# Patient Record
Sex: Female | Born: 2000 | Race: Black or African American | Hispanic: No | Marital: Single | Smoking: Never smoker
Health system: Southern US, Community
[De-identification: ages and names within clinical notes are randomized; demographics above are authoritative.]

## PROBLEM LIST (undated history)

## (undated) DIAGNOSIS — G43909 Migraine, unspecified, not intractable, without status migrainosus: Secondary | ICD-10-CM

## (undated) DIAGNOSIS — R519 Headache, unspecified: Secondary | ICD-10-CM

## (undated) DIAGNOSIS — J45909 Unspecified asthma, uncomplicated: Secondary | ICD-10-CM

## (undated) DIAGNOSIS — F32A Depression, unspecified: Secondary | ICD-10-CM

## (undated) DIAGNOSIS — F329 Major depressive disorder, single episode, unspecified: Secondary | ICD-10-CM

## (undated) DIAGNOSIS — R51 Headache: Secondary | ICD-10-CM

## (undated) HISTORY — DX: Migraine, unspecified, not intractable, without status migrainosus: G43.909

## (undated) HISTORY — DX: Unspecified asthma, uncomplicated: J45.909

## (undated) HISTORY — DX: Depression, unspecified: F32.A

## (undated) HISTORY — DX: Headache, unspecified: R51.9

## (undated) HISTORY — DX: Major depressive disorder, single episode, unspecified: F32.9

## (undated) HISTORY — DX: Headache: R51

---

## 2018-12-01 ENCOUNTER — Emergency Department (HOSPITAL_COMMUNITY): Payer: Medicaid Other

## 2018-12-01 ENCOUNTER — Emergency Department (HOSPITAL_COMMUNITY)
Admission: EM | Admit: 2018-12-01 | Discharge: 2018-12-02 | Disposition: A | Discharge status: Home / Self Care | Payer: Medicaid Other | Attending: Emergency Medicine | Admitting: Emergency Medicine

## 2018-12-01 ENCOUNTER — Other Ambulatory Visit: Payer: Self-pay

## 2018-12-01 ENCOUNTER — Encounter (HOSPITAL_COMMUNITY): Payer: Self-pay

## 2018-12-01 DIAGNOSIS — R109 Unspecified abdominal pain: Secondary | ICD-10-CM | POA: Insufficient documentation

## 2018-12-01 DIAGNOSIS — R05 Cough: Secondary | ICD-10-CM | POA: Insufficient documentation

## 2018-12-01 DIAGNOSIS — N12 Tubulo-interstitial nephritis, not specified as acute or chronic: Secondary | ICD-10-CM

## 2018-12-01 LAB — CBC WITH DIFFERENTIAL/PLATELET
ABS IMMATURE GRANULOCYTES: 0.03 10*3/uL (ref 0.00–0.07)
BASOS PCT: 0 %
Basophils Absolute: 0 10*3/uL (ref 0.0–0.1)
Eosinophils Absolute: 0 10*3/uL (ref 0.0–0.5)
Eosinophils Relative: 0 %
HCT: 42.9 % (ref 36.0–46.0)
Hemoglobin: 13.5 g/dL (ref 12.0–15.0)
Immature Granulocytes: 0 %
Lymphocytes Relative: 12 %
Lymphs Abs: 1.1 10*3/uL (ref 0.7–4.0)
MCH: 29.1 pg (ref 26.0–34.0)
MCHC: 31.5 g/dL (ref 30.0–36.0)
MCV: 92.5 fL (ref 80.0–100.0)
Monocytes Absolute: 0.9 10*3/uL (ref 0.1–1.0)
Monocytes Relative: 9 %
Neutro Abs: 7.7 10*3/uL (ref 1.7–7.7)
Neutrophils Relative %: 79 %
Platelets: 196 10*3/uL (ref 150–400)
RBC: 4.64 MIL/uL (ref 3.87–5.11)
RDW: 12.2 % (ref 11.5–15.5)
WBC: 9.7 10*3/uL (ref 4.0–10.5)
nRBC: 0 % (ref 0.0–0.2)

## 2018-12-01 LAB — COMPREHENSIVE METABOLIC PANEL
ALT: 32 U/L (ref 0–44)
AST: 28 U/L (ref 15–41)
Albumin: 4.7 g/dL (ref 3.5–5.0)
Alkaline Phosphatase: 59 U/L (ref 38–126)
Anion gap: 11 (ref 5–15)
BUN: 15 mg/dL (ref 6–20)
CO2: 21 mmol/L — AB (ref 22–32)
Calcium: 9 mg/dL (ref 8.9–10.3)
Chloride: 99 mmol/L (ref 98–111)
Creatinine, Ser: 0.96 mg/dL (ref 0.44–1.00)
GFR calc Af Amer: >60 mL/min (ref >60)
GFR calc non Af Amer: >60 mL/min (ref >60)
Glucose, Bld: 99 mg/dL (ref 70–99)
Potassium: 4 mmol/L (ref 3.5–5.1)
Sodium: 131 mmol/L — ABNORMAL LOW (ref 135–145)
Total Bilirubin: 1.9 mg/dL — ABNORMAL HIGH (ref 0.3–1.2)
Total Protein: 8.2 g/dL — ABNORMAL HIGH (ref 6.5–8.1)

## 2018-12-01 LAB — URINALYSIS, ROUTINE W REFLEX MICROSCOPIC
Bilirubin Urine: NEGATIVE
Glucose, UA: NEGATIVE mg/dL
Ketones, ur: 20 mg/dL — AB
LEUKOCYTE UA: NEGATIVE
Nitrite: POSITIVE — AB
Protein, ur: NEGATIVE mg/dL
Specific Gravity, Urine: 1.016 (ref 1.005–1.030)
pH: 6 (ref 5.0–8.0)

## 2018-12-01 LAB — POC URINE PREG, ED: Preg Test, Ur: NEGATIVE

## 2018-12-01 LAB — LACTIC ACID, PLASMA: LACTIC ACID, VENOUS: 1.6 mmol/L (ref 0.5–1.9)

## 2018-12-01 LAB — INFLUENZA PANEL BY PCR (TYPE A & B)
Influenza A By PCR: NEGATIVE
Influenza B By PCR: NEGATIVE

## 2018-12-01 MED ORDER — SODIUM CHLORIDE 0.9 % IV BOLUS
1000.0000 mL | Freq: Once | INTRAVENOUS | Status: AC
  Administered 2018-12-02: 1000 mL via INTRAVENOUS

## 2018-12-01 MED ORDER — NAPROXEN 375 MG PO TABS: 375.0000 mg | ORAL_TABLET | Freq: Two times a day (BID) | ORAL | 0 refills | Status: DC

## 2018-12-01 MED ORDER — SODIUM CHLORIDE 0.9 % IV SOLN: 2.0000 g | Freq: Once | INTRAVENOUS | Status: DC

## 2018-12-01 MED ORDER — VANCOMYCIN HCL 10 G IV SOLR
1250.0000 mg | Freq: Once | INTRAVENOUS | Status: DC
  Filled 2018-12-01: qty 1250

## 2018-12-01 MED ORDER — METRONIDAZOLE IN NACL 5-0.79 MG/ML-% IV SOLN: 500.0000 mg | Freq: Three times a day (TID) | INTRAVENOUS | Status: DC

## 2018-12-01 MED ORDER — SODIUM CHLORIDE 0.9 % IV SOLN
INTRAVENOUS | Status: DC
  Administered 2018-12-02: 02:00:00 via INTRAVENOUS

## 2018-12-01 MED ORDER — VANCOMYCIN HCL IN DEXTROSE 1-5 GM/200ML-% IV SOLN: 1000.0000 mg | Freq: Once | INTRAVENOUS | Status: DC

## 2018-12-01 MED ORDER — SODIUM CHLORIDE 0.9 % IV SOLN
2.0000 g | Freq: Once | INTRAVENOUS | Status: AC
  Administered 2018-12-02: 2 g via INTRAVENOUS
  Filled 2018-12-01: qty 20

## 2018-12-01 NOTE — ED Provider Notes (Signed)
[facility]-EMERGENCY DEPT Provider Note   CSN: [id number] Arrival date & time: 12/01/18  1624    History   Chief Complaint Chief Complaint  Patient presents with  . Rib Pain    R    HPI Rita Mcintosh is a 18 y.o. female who presents to the ED with c/o right rib pain. The pain has been off and on for months. Pain increases with deep breath and movement. Patient does cardio workout but does not remember any injury. Patient has taken nothing for pain. Patient denies chest pain, abdominal pain or shortness of breath. Patient reports having a cough and the pain increases with coughing. Today temp over 101. Patient reports that when the pain comes she develops fever and chills. Patient has been evaluated before and treated for UTI but then was called and told urine culture was negative.      HPI  History reviewed. No pertinent past medical history.  There are no active problems to display for this patient.    OB History   No obstetric history on file.      Home Medications    Prior to Admission medications   Not on File    Family History History reviewed. No pertinent family history.  Social History Social History   Tobacco Use  . Smoking status: Not on file  Substance Use Topics  . Alcohol use: Not on file  . Drug use: Not on file     Allergies   Patient has no known allergies.   Review of Systems Review of Systems  Constitutional: Positive for chills and fever.  HENT: Negative.   Eyes: Negative for visual disturbance.  Respiratory: Positive for cough. Negative for shortness of breath.   Cardiovascular: Negative for chest pain.  Gastrointestinal: Negative for abdominal pain, diarrhea, nausea and vomiting.  Genitourinary: Negative for dysuria, frequency and urgency.  Musculoskeletal: Positive for arthralgias.       Right rib pain  Skin: Negative for rash.  Neurological: Negative for syncope and headaches.    Psychiatric/Behavioral: Negative for confusion.     Physical Exam Updated Vital Signs BP 117/73 (BP Location: Right Arm)   Pulse (!) 113   Temp (!) 101.3 F (38.5 C) (Oral)   Resp 18   LMP  (LMP Unknown)   SpO2 100%   Physical Exam Vitals signs and nursing note reviewed.  Constitutional:      Appearance: She is well-developed.  HENT:     Head: Normocephalic.     Nose: Nose normal.     Mouth/Throat:     Mouth: Mucous membranes are moist.  Eyes:     Extraocular Movements: Extraocular movements intact.     Conjunctiva/sclera: Conjunctivae normal.  Neck:     Musculoskeletal: Neck supple.  Cardiovascular:     Rate and Rhythm: Regular rhythm. Tachycardia present.  Pulmonary:     Effort: Pulmonary effort is normal. No respiratory distress.     Breath sounds: No wheezing or rales.     Comments: Right flank pain Abdominal:     Palpations: Abdomen is soft.     Tenderness: There is no abdominal tenderness.  Musculoskeletal: Normal range of motion.  Skin:    General: Skin is warm and dry.  Neurological:     Mental Status: She is alert and oriented to person, place, and time.  Psychiatric:        Mood and Affect: Mood normal.      ED Treatments / Results  Labs (all  labs ordered are listed, but only abnormal results are displayed) Labs Reviewed  URINALYSIS, ROUTINE W REFLEX MICROSCOPIC - Abnormal; Notable for the following components:      Result Value   APPearance HAZY (*)    Hgb urine dipstick SMALL (*)    Ketones, ur 20 (*)    Nitrite POSITIVE (*)    Bacteria, UA RARE (*)    All other components within normal limits  CULTURE, BLOOD (ROUTINE X 2)  CULTURE, BLOOD (ROUTINE X 2)  INFLUENZA PANEL BY PCR (TYPE A & B)  CBC WITH DIFFERENTIAL/PLATELET  COMPREHENSIVE METABOLIC PANEL  LACTIC ACID, PLASMA  LACTIC ACID, PLASMA  POC URINE PREG, ED  Radiology Dg Ribs Unilateral W/chest Right  Result Date: 12/01/2018 CLINICAL DATA:  Right-sided rib pain. EXAM: RIGHT RIBS  AND CHEST - 3+ VIEW COMPARISON:  None. FINDINGS: No fracture or other bone lesions are seen involving the ribs. There is no evidence of pneumothorax or pleural effusion. Both lungs are clear. Heart size and mediastinal contours are within normal limits. IMPRESSION: Negative. Electronically Signed   By: Ted Mcalpine M.D.   On: 12/01/2018 18:31    Procedures Procedures (including critical care time)  Medications Ordered in ED Medications  0.9 %  sodium chloride infusion (has no administration in time range)     Initial Impression / Assessment and Plan / ED Course  I have reviewed the triage vital signs and the nursing notes. 18 y.o. female here with fever, chills and right flank pain but denies UTI symptoms. Urine nitrite positive. Labs and renal ultrasound ordered. Dr. Erma Heritage to assume care of the patient.   Final Clinical Impressions(s) / ED Diagnoses      Kerrie [facility], NP 12/01/18 2156    Shaune Pollack, MD 12/02/18 [phone]

## 2018-12-01 NOTE — Discharge Instructions (Addendum)
Follow up with your doctor or return here for worsening symptoms. °

## 2018-12-01 NOTE — Progress Notes (Signed)
A consult was received from an ED physician for cefepime and vancomycin per pharmacy dosing.  The patient's profile has been reviewed for ht/wt/allergies/indication/available labs.   A one time order has been placed for Cefepime 2 Gm and Vancomycin 1250 mg.  Further antibiotics/pharmacy consults should be ordered by admitting physician if indicated.                       Thank you, Lorenza Evangelist 12/01/2018  11:30 PM

## 2018-12-01 NOTE — ED Triage Notes (Addendum)
Pt reports R rib pain x "some months." No distress. No SOB. Deep breaths worsen pain. Denies injury.

## 2018-12-02 ENCOUNTER — Encounter (HOSPITAL_COMMUNITY): Payer: Self-pay

## 2018-12-02 ENCOUNTER — Emergency Department (HOSPITAL_COMMUNITY): Payer: Medicaid Other

## 2018-12-02 LAB — SEDIMENTATION RATE: Sed Rate: 11 mm/hr (ref 0–22)

## 2018-12-02 LAB — C-REACTIVE PROTEIN: CRP: 5.1 mg/dL — ABNORMAL HIGH (ref <1.0)

## 2018-12-02 MED ORDER — SODIUM CHLORIDE (PF) 0.9 % IJ SOLN
INTRAMUSCULAR | Status: AC
  Filled 2018-12-02: qty 50

## 2018-12-02 MED ORDER — CEPHALEXIN 500 MG PO CAPS: 500.0000 mg | ORAL_CAPSULE | Freq: Four times a day (QID) | ORAL | 0 refills | Status: DC

## 2018-12-02 MED ORDER — IOPAMIDOL (ISOVUE-300) INJECTION 61%
INTRAVENOUS | Status: AC
  Filled 2018-12-02: qty 100

## 2018-12-02 MED ORDER — IOPAMIDOL (ISOVUE-300) INJECTION 61%
100.0000 mL | Freq: Once | INTRAVENOUS | Status: AC | PRN
  Administered 2018-12-02: 100 mL via INTRAVENOUS

## 2018-12-04 LAB — URINE CULTURE
Culture: >=100000 — AB
Special Requests: NORMAL

## 2018-12-05 ENCOUNTER — Telehealth: Payer: Self-pay | Admitting: *Deleted

## 2018-12-05 NOTE — Telephone Encounter (Signed)
Post ED Visit - Positive Culture Follow-up  Culture report reviewed by antimicrobial stewardship pharmacist: [facility] []  Enzo Bi, Pharm.D. []  Celedonio Miyamoto, Pharm.D., BCPS AQ-ID []  Garvin Fila, Pharm.D., BCPS []  Georgina Pillion, Pharm.D., BCPS []  [place], [address].D., BCPS, AAHIVP []  Estella Husk, Pharm.D., BCPS, AAHIVP []  Lysle Pearl, PharmD, BCPS []  Phillips Climes, PharmD, BCPS []  Agapito Games, PharmD, BCPS []  Verlan Friends, PharmD []  Mervyn Gay, PharmD, BCPS []  Vinnie Level, PharmD  [facility] []  Len Childs, PharmD []  Greer Pickerel, PharmD []  Adalberto Cole, PharmD []  Perlie Gold, Rph []  Lonell Face) Jean Rosenthal, PharmD []  Earl Many, PharmD []  Junita Push, PharmD [x]  Dorna Leitz, PharmD []  Terrilee Files, PharmD []  Lynann Beaver, PharmD []  Keturah Barre, PharmD []  Loralee Pacas, PharmD []  Bernadene Person, PharmD   Positive urine culture Treated with Cephalexin, organism sensitive to the same and no further patient follow-up is required at this time.  [facility] 12/05/2018, 12:31 PM

## 2018-12-06 LAB — CULTURE, BLOOD (ROUTINE X 2)
Culture: NO GROWTH
Culture: NO GROWTH
Special Requests: ADEQUATE
Special Requests: ADEQUATE

## 2018-12-14 ENCOUNTER — Encounter: Payer: Self-pay | Admitting: Family Medicine

## 2018-12-14 ENCOUNTER — Ambulatory Visit (INDEPENDENT_AMBULATORY_CARE_PROVIDER_SITE_OTHER): Payer: 59 | Admitting: Family Medicine

## 2018-12-14 ENCOUNTER — Other Ambulatory Visit: Payer: Self-pay

## 2018-12-14 VITALS — BP 98/54 | HR 83 | Temp 98.3°F | Resp 16 | Ht 66.0 in | Wt 121.8 lb

## 2018-12-14 DIAGNOSIS — N12 Tubulo-interstitial nephritis, not specified as acute or chronic: Secondary | ICD-10-CM

## 2018-12-14 DIAGNOSIS — R300 Dysuria: Secondary | ICD-10-CM

## 2018-12-14 HISTORY — DX: Tubulo-interstitial nephritis, not specified as acute or chronic: N12

## 2018-12-14 LAB — POCT URINALYSIS DIPSTICK
Bilirubin, UA: NEGATIVE
Glucose, UA: NEGATIVE
Ketones, UA: NEGATIVE
Nitrite, UA: NEGATIVE
Protein, UA: NEGATIVE
RBC UA: NEGATIVE
Spec Grav, UA: 1.02 (ref 1.010–1.025)
Urobilinogen, UA: 0.2 E.U./dL
pH, UA: 6.5 (ref 5.0–8.0)

## 2018-12-14 NOTE — Addendum Note (Signed)
Addended by: Mammie Lorenzo on: 12/14/2018 10:46 AM   Modules accepted: Orders

## 2018-12-14 NOTE — Assessment & Plan Note (Signed)
-  Symptoms of pyelonephritis have essentially resolved at this point -Mild dysuria after intercourse, will recheck UA.  -Otherwise will plan to f/u PRN.

## 2018-12-14 NOTE — Progress Notes (Signed)
Rita Mcintosh - 18 y.o. female MRN [id number]  Date of birth: Feb 18, 2001  Subjective Chief Complaint  Patient presents with  . Follow-up    Pt states she is doing ok, she finsihed medication that she was prescribed, here today becase they suggested her f/u with a primary physican     HPI Rita Mcintosh is a 18 y.o. female here today for hospital follow up.  She was recently seen in ED rib/flank pain and diagnosed with pyelonephritis.  Given cefepime, vancomycin and flagyl in ED and discharged with cephalexin qid.  She reports that symptoms have resolved at this time and denies back/flank pain, fever, chills, nausea or vomiting.   She has had some mild dysuria after intercourse.  Denies vaginal discharge.    ROS:  A comprehensive ROS was completed and negative except as noted per HPI  No Known Allergies  History reviewed. No pertinent past medical history.  History reviewed. No pertinent surgical history.  Social History   Socioeconomic History  . Marital status: Single    Spouse name: Not on file  . Number of children: Not on file  . Years of education: Not on file  . Highest education level: Not on file  Occupational History  . Not on file  Social Needs  . Financial resource strain: Not on file  . Food insecurity:    Worry: Not on file    Inability: Not on file  . Transportation needs:    Medical: Not on file    Non-medical: Not on file  Tobacco Use  . Smoking status: Never Smoker  . Smokeless tobacco: Never Used  Substance and Sexual Activity  . Alcohol use: Never    Frequency: Never  . Drug use: Never  . Sexual activity: Not on file  Lifestyle  . Physical activity:    Days per week: Not on file    Minutes per session: Not on file  . Stress: Not on file  Relationships  . Social connections:    Talks on phone: Not on file    Gets together: Not on file    Attends religious service: Not on file    Active member of club or organization: Not on file    Attends  meetings of clubs or organizations: Not on file    Relationship status: Not on file  Other Topics Concern  . Not on file  Social History Narrative  . Not on file    No family history on file.  Health Maintenance  Topic Date Due  . HIV Screening  10/18/2013  . TETANUS/TDAP  10/18/2017  . INFLUENZA VACCINE  05/10/2018    ----------------------------------------------------------------------------------------------------------------------------------------------------------------------------------------------------------------- Physical Exam BP (!) 98/54 (BP Location: Left Arm, Patient Position: Sitting, Cuff Size: Normal)   Pulse 83   Temp 98.3 F (36.8 C) (Oral)   Resp 16   Ht 5\' 6"  (1.676 m)   Wt 121 lb 12.8 oz (55.2 kg)   LMP 12/07/2018   SpO2 95%   BMI 19.66 kg/m   Physical Exam Constitutional:      Appearance: Normal appearance.  HENT:     Head: Normocephalic and atraumatic.     Mouth/Throat:     Mouth: Mucous membranes are moist.  Eyes:     General: No scleral icterus. Neck:     Musculoskeletal: Neck supple.  Cardiovascular:     Rate and Rhythm: Normal rate and regular rhythm.  Pulmonary:     Effort: Pulmonary effort is normal.     Breath sounds:  Normal breath sounds.  Abdominal:     General: Abdomen is flat. Bowel sounds are normal. There is no distension.     Palpations: Abdomen is soft.     Tenderness: There is no abdominal tenderness. There is no right CVA tenderness or left CVA tenderness.  Skin:    General: Skin is warm and dry.  Neurological:     General: No focal deficit present.     Mental Status: She is alert.  Psychiatric:        Mood and Affect: Mood normal.        Behavior: Behavior normal.     ------------------------------------------------------------------------------------------------------------------------------------------------------------------------------------------------------------------- Assessment and  Plan  Pyelonephritis -Symptoms of pyelonephritis have essentially resolved at this point -Mild dysuria after intercourse, will recheck UA.  -Otherwise will plan to f/u PRN.

## 2018-12-14 NOTE — Patient Instructions (Signed)
-  Great to meet you! -Glad you are feeling better -We'll be in touch with urine results.

## 2018-12-15 LAB — URINE CULTURE
MICRO NUMBER:: 287119
Result:: NO GROWTH
SPECIMEN QUALITY:: ADEQUATE

## 2019-09-24 ENCOUNTER — Encounter: Payer: Self-pay | Admitting: Family Medicine

## 2019-09-24 ENCOUNTER — Telehealth (INDEPENDENT_AMBULATORY_CARE_PROVIDER_SITE_OTHER): Payer: Managed Care, Other (non HMO) | Admitting: Family Medicine

## 2019-09-24 DIAGNOSIS — S060X0A Concussion without loss of consciousness, initial encounter: Secondary | ICD-10-CM | POA: Diagnosis not present

## 2019-09-24 DIAGNOSIS — S060XAA Concussion with loss of consciousness status unknown, initial encounter: Secondary | ICD-10-CM | POA: Insufficient documentation

## 2019-09-24 DIAGNOSIS — S060X9A Concussion with loss of consciousness of unspecified duration, initial encounter: Secondary | ICD-10-CM | POA: Insufficient documentation

## 2019-09-24 MED ORDER — ONDANSETRON 4 MG PO TBDP: 4.0000 mg | ORAL_TABLET | Freq: Three times a day (TID) | ORAL | 0 refills | Status: DC | PRN

## 2019-09-24 NOTE — Assessment & Plan Note (Signed)
Her current symptoms are consistent with concussion.  Discussed avoiding any strenuous activities for now.  She should push fluids and get adequate sleep and nutrition.  She should rest as needed.  Avoid prolonged electronic screen time.  Reviewed red flags.  Rx for zofran for nausea.  Will complete paperwork for FMLA to stay out of work for the next 7-10 days as her job requires her to be in front of a computer all day.
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
## 2019-09-26 ENCOUNTER — Telehealth: Payer: Self-pay

## 2019-09-26 NOTE — Telephone Encounter (Signed)
Please allow up to 1 week from date of receipt for completion of FMLA paperwork.  It should be able to complete by tomorrow or at the latest on Monday.

## 2019-09-26 NOTE — Telephone Encounter (Unsigned)
Copied from CRM #[id number]. Topic: General - Inquiry >> Sep 26, 2019  9:33 AM Pettigrew, Teressa D wrote: Reason for CRM: pt sent FMLA papers in through My Chart in an attachment and she would like to know when they will be completed for her employer.    Pt went back to the ER yesterday and the doctor there gave her promethazine 25 mg and told her it would make for drowsy and she may not be able to work taking this medicaiton.  CB#  [phone] 

## 2019-09-26 NOTE — Telephone Encounter (Signed)
Message sent via my chart

## 2019-09-26 NOTE — Telephone Encounter (Signed)
Copied from [place] [id number]. Topic: General - Inquiry >> Sep 26, 2019  9:33 AM Greggory Keen D wrote: Reason for CRM: pt sent FMLA papers in through My Chart in an attachment and she would like to know when they will be completed for her employer.    Pt went back to the ER yesterday and the doctor there gave her promethazine 25 mg and told her it would make for drowsy and she may not be able to work taking this medicaiton.  CB#  [phone]

## 2019-10-14 ENCOUNTER — Encounter: Payer: Self-pay | Admitting: Family Medicine

## 2019-10-14 ENCOUNTER — Telehealth: Payer: Self-pay

## 2019-10-14 NOTE — Telephone Encounter (Signed)

## 2019-10-15 ENCOUNTER — Other Ambulatory Visit: Payer: Self-pay

## 2019-10-15 ENCOUNTER — Ambulatory Visit (INDEPENDENT_AMBULATORY_CARE_PROVIDER_SITE_OTHER): Payer: Self-pay | Admitting: Family Medicine

## 2019-10-15 ENCOUNTER — Encounter: Payer: Self-pay | Admitting: Family Medicine

## 2019-10-15 VITALS — BP 118/70 | HR 96 | Temp 97.9°F | Ht 66.0 in | Wt 120.0 lb

## 2019-10-15 DIAGNOSIS — R1030 Lower abdominal pain, unspecified: Secondary | ICD-10-CM

## 2019-10-15 DIAGNOSIS — S060X0A Concussion without loss of consciousness, initial encounter: Secondary | ICD-10-CM

## 2019-10-15 LAB — POCT URINALYSIS DIPSTICK
Bilirubin, UA: NEGATIVE
Blood, UA: NEGATIVE
Glucose, UA: NEGATIVE
Ketones, UA: NEGATIVE
Leukocytes, UA: NEGATIVE
Nitrite, UA: NEGATIVE
Protein, UA: NEGATIVE
Spec Grav, UA: 1.01 (ref 1.010–1.025)
Urobilinogen, UA: 1 E.U./dL
pH, UA: 8.5 — AB (ref 5.0–8.0)

## 2019-10-15 NOTE — Patient Instructions (Signed)
Try using blue light blocking glasses when using computer/phone for prolonged period of time.  Continue to rest and hydrate well.

## 2019-10-21 ENCOUNTER — Encounter: Payer: Self-pay | Admitting: Family Medicine
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
## 2019-10-21 NOTE — Telephone Encounter (Signed)
Sure, have them fax over or she can drop off form to complete.

## 2019-10-21 NOTE — Assessment & Plan Note (Signed)
Symptoms slowly improving still with headache exacerbated by electronic screen.   Recommend trial of blue light blocking glasses Limit screen time, she should take at least a 30 minute break for every 2 hours of screen time, letter provided.  She may need additional FMLA paperwork completed which I agreed to complete as needed.

## 2019-10-25 NOTE — Telephone Encounter (Signed)
Completed.

## 2019-10-31 ENCOUNTER — Encounter: Payer: Self-pay | Admitting: Family Medicine

## 2020-03-06 ENCOUNTER — Telehealth (INDEPENDENT_AMBULATORY_CARE_PROVIDER_SITE_OTHER): Payer: BC Managed Care – PPO | Admitting: Nurse Practitioner

## 2020-03-06 ENCOUNTER — Encounter: Payer: Self-pay | Admitting: Nurse Practitioner

## 2020-03-06 DIAGNOSIS — J0140 Acute pansinusitis, unspecified: Secondary | ICD-10-CM | POA: Diagnosis not present

## 2020-03-06 MED ORDER — PREDNISONE 20 MG PO TABS: 30.0000 mg | ORAL_TABLET | Freq: Every day | ORAL | 0 refills | Status: DC

## 2020-03-06 MED ORDER — SALINE SPRAY 0.65 % NA SOLN: 1.0000 | NASAL | 0 refills | Status: DC | PRN

## 2020-03-06 MED ORDER — MUCINEX 600 MG PO TB12: 600.0000 mg | ORAL_TABLET | Freq: Two times a day (BID) | ORAL | 0 refills | Status: DC | PRN

## 2020-03-06 MED ORDER — AZITHROMYCIN 250 MG PO TABS: 250.0000 mg | ORAL_TABLET | Freq: Every day | ORAL | 0 refills | Status: DC

## 2020-03-06 NOTE — Patient Instructions (Signed)
Stop oral decongestant Maintain adequate oral hydration Take above medications with food. Call office if no improvement in 2weeks.

## 2020-03-06 NOTE — Progress Notes (Signed)
Virtual Visit via Video Note  I connected with@ on 03/06/20 at 11:00 AM EDT by a video enabled telemedicine application and verified that I am speaking with the correct person using two identifiers.  Location: Patient:Home Provider: Office Participants: patient and provider  I discussed the limitations of evaluation and management by telemedicine and the availability of in person appointments. I also discussed with the patient that there may be a patient responsible charge related to this service. The patient expressed understanding and agreed to proceed.  CC:She is C/O congestion that started 1-week-ago. There is pressure that is periorbital, frontal, and temporal. She says that she feels like she has a constant severe headache that is behind her right eye. Denies any ear issues. Nasal mucous has gone from yellow to green. Denies any cough. Had negative Covid test. Tx with Robitussin and DayQuil but Sx persist.  History of Present Illness: Sinusitis This is a new problem. The current episode started 1 to 4 weeks ago. The problem has been gradually worsening since onset. There has been no fever. Associated symptoms include congestion, ear pain and headaches. Pertinent negatives include no chills, coughing, diaphoresis, hoarse voice, neck pain, shortness of breath, sinus pressure, sneezing, sore throat or swollen glands. Past treatments include acetaminophen and oral decongestants. The treatment provided mild relief.  amoxicillin 500mg  used tooth infection 1week ago.  Observations/Objective: Physical Exam  Constitutional: She is oriented to person, place, and time. No distress.  Eyes: Conjunctivae and EOM are normal.  Pulmonary/Chest: Effort normal.  Musculoskeletal:     Cervical back: Normal range of motion and neck supple.  Neurological: She is alert and oriented to person, place, and time.   Assessment and Plan: Laylah was seen today for transitions of care and  sinusitis.  Diagnoses and all orders for this visit:  Acute non-recurrent pansinusitis -     azithromycin (ZITHROMAX Z-PAK) 250 MG tablet; Take 1 tablet (250 mg total) by mouth daily. Take 2tabs on first day, then 1tab once a day till complete -     predniSONE (DELTASONE) 20 MG tablet; Take 1.5 tablets (30 mg total) by mouth daily with breakfast. -     sodium chloride (OCEAN) 0.65 % SOLN nasal spray; Place 1 spray into both nostrils as needed for congestion. -     guaiFENesin (MUCINEX) 600 MG 12 hr tablet; Take 1 tablet (600 mg total) by mouth 2 (two) times daily as needed for cough or to loosen phlegm.   Follow Up Instructions: Stop oral decongestant Maintain adequate oral hydration Take above medications with food. Call office if no improvement in 2weeks.  I discussed the assessment and treatment plan with the patient. The patient was provided an opportunity to ask questions and all were answered. The patient agreed with the plan and demonstrated an understanding of the instructions.   The patient was advised to call back or seek an in-person evaluation if the symptoms worsen or if the condition fails to improve as anticipated.   , NP

## 2020-06-26 ENCOUNTER — Telehealth (INDEPENDENT_AMBULATORY_CARE_PROVIDER_SITE_OTHER): Payer: BC Managed Care – PPO | Admitting: Nurse Practitioner

## 2020-06-26 ENCOUNTER — Telehealth: Payer: BC Managed Care – PPO | Admitting: Emergency Medicine

## 2020-06-26 ENCOUNTER — Encounter: Payer: Self-pay | Admitting: Nurse Practitioner

## 2020-06-26 VITALS — Ht 66.0 in

## 2020-06-26 DIAGNOSIS — J069 Acute upper respiratory infection, unspecified: Secondary | ICD-10-CM | POA: Diagnosis not present

## 2020-06-26 DIAGNOSIS — Z20822 Contact with and (suspected) exposure to covid-19: Secondary | ICD-10-CM | POA: Diagnosis not present

## 2020-06-26 MED ORDER — BENZONATATE 100 MG PO CAPS: 100.0000 mg | ORAL_CAPSULE | Freq: Three times a day (TID) | ORAL | 0 refills | Status: DC | PRN

## 2020-06-26 MED ORDER — ALBUTEROL SULFATE HFA 108 (90 BASE) MCG/ACT IN AERS: 1.0000 | INHALATION_SPRAY | Freq: Four times a day (QID) | RESPIRATORY_TRACT | 0 refills | Status: DC | PRN

## 2020-06-26 NOTE — Progress Notes (Signed)
Virtual Visit via Video Note  I connected with@ on 06/26/20 at 10:45 AM EDT by a video enabled telemedicine application and verified that I am speaking with the correct person using two identifiers.  Location: Patient:Home Provider: Office Participants: patient and provider  I discussed the limitations of evaluation and management by telemedicine and the availability of in person appointments. I also discussed with the patient that there may be a patient responsible charge related to this service. The patient expressed understanding and agreed to proceed.  CC: cough and fever, no COVID vaccine  History of Present Illness: Cough This is a new problem. The current episode started in the past 7 days. The problem has been unchanged. The cough is non-productive. Associated symptoms include chills, a fever and headaches. Pertinent negatives include no heartburn, hemoptysis, myalgias, nasal congestion, postnasal drip, rash, rhinorrhea, sore throat, shortness of breath, sweats or wheezing. Associated symptoms comments: Chest tightness with deep breathing. She has tried OTC cough suppressant for the symptoms. The treatment provided mild relief. There is no history of asthma or environmental allergies.   Observations/Objective: Physical Exam Vitals reviewed.  Constitutional:      General: She is not in acute distress.    Appearance: She is not ill-appearing.  Cardiovascular:     Pulses: Normal pulses.  Neurological:     Mental Status: She is alert and oriented to person, place, and time.    Assessment and Plan: Rita Mcintosh was seen today for acute visit.  Diagnoses and all orders for this visit:  Viral upper respiratory tract infection -     albuterol (VENTOLIN HFA) 108 (90 Base) MCG/ACT inhaler; Inhale 1-2 puffs into the lungs every 6 (six) hours as needed for wheezing or shortness of breath.   Follow Up Instructions: Encourage adequate oral hydration. Take benzonatate and albuterol as  prescribed. Use over-the-counter  "cold" medicines  such as "Tylenol cold" , "Advil cold",  "Mucinex" or" Mucinex D"  for cough and congestion.  Avoid decongestants if you have high blood pressure. Use" Delsym" or" Robitussin" cough syrup varietis for cough.  You can use plain "Tylenol" or "Advi"l for fever, chills and achyness.  "Common cold" symptoms are usually triggered by a virus.  The antibiotics are usually not necessary. On average, a" viral cold" illness would take 4-7 days to resolve. Please, make an appointment if you are not better or if you're worse.  I discussed the assessment and treatment plan with the patient. The patient was provided an opportunity to ask questions and all were answered. The patient agreed with the plan and demonstrated an understanding of the instructions.   The patient was advised to call back or seek an in-person evaluation if the symptoms worsen or if the condition fails to improve as anticipated. Alysia Penna, NP

## 2020-06-26 NOTE — Patient Instructions (Signed)
Encourage adequate oral hydration. Use over-the-counter  "cold" medicines  such as "Tylenol cold" , "Advil cold",  "Mucinex" or" Mucinex D"  for cough and congestion.  Avoid decongestants if you have high blood pressure. Use" Delsym" or" Robitussin" cough syrup varietis for cough.  You can use plain "Tylenol" or "Advi"l for fever, chills and achyness.  "Common cold" symptoms are usually triggered by a virus.  The antibiotics are usually not necessary. On average, a" viral cold" illness would take 4-7 days to resolve. Please, make an appointment if you are not better or if you're worse.     Person Under Monitoring Name: Rita Mcintosh  Location: [address] [place] [address]   Infection Prevention Recommendations for Individuals Confirmed to have, or Being Evaluated for, 2019 Novel Coronavirus (COVID-19) Infection Who Receive Care at Home  Individuals who are confirmed to have, or are being evaluated for, COVID-19 should follow the prevention steps below until a healthcare provider or local or state health department says they can return to normal activities.  Stay home except to get medical care You should restrict activities outside your home, except for getting medical care. Do not go to work, school, or public areas, and do not use public transportation or taxis.  Call ahead before visiting your doctor Before your medical appointment, call the healthcare provider and tell them that you have, or are being evaluated for, COVID-19 infection. This will help the healthcare provider's office take steps to keep other people from getting infected. Ask your healthcare provider to call the local or state health department.  Monitor your symptoms Seek prompt medical attention if your illness is worsening (e.g., difficulty breathing). Before going to your medical appointment, call the healthcare provider and tell them that you have, or are being evaluated for, COVID-19 infection.  Ask your healthcare provider to call the local or state health department.  Wear a facemask You should wear a facemask that covers your nose and mouth when you are in the same room with other people and when you visit a healthcare provider. People who live with or visit you should also wear a facemask while they are in the same room with you.  Separate yourself from other people in your home As much as possible, you should stay in a different room from other people in your home. Also, you should use a separate bathroom, if available.  Avoid sharing household items You should not share dishes, drinking glasses, cups, eating utensils, towels, bedding, or other items with other people in your home. After using these items, you should wash them thoroughly with soap and water.  Cover your coughs and sneezes Cover your mouth and nose with a tissue when you cough or sneeze, or you can cough or sneeze into your sleeve. Throw used tissues in a lined trash can, and immediately wash your hands with soap and water for at least 20 seconds or use an alcohol-based hand rub.  Wash your [employer] your hands often and thoroughly with soap and water for at least 20 seconds. You can use an alcohol-based hand sanitizer if soap and water are not available and if your hands are not visibly dirty. Avoid touching your eyes, nose, and mouth with unwashed hands.   Prevention Steps for Caregivers and Household Members of Individuals Confirmed to have, or Being Evaluated for, COVID-19 Infection Being Cared for in the Home  If you live with, or provide care at home for, a person confirmed to have,  or being evaluated for, COVID-19 infection please follow these guidelines to prevent infection:  Follow healthcare provider's instructions Make sure that you understand and can help the patient follow any healthcare provider instructions for all care.  Provide for the patient's basic needs You should help the  patient with basic needs in the home and provide support for getting groceries, prescriptions, and other personal needs.  Monitor the patient's symptoms If they are getting sicker, call his or her medical provider and tell them that the patient has, or is being evaluated for, COVID-19 infection. This will help the healthcare provider's office take steps to keep other people from getting infected. Ask the healthcare provider to call the local or state health department.  Limit the number of people who have contact with the patient  If possible, have only one caregiver for the patient.  Other household members should stay in another home or place of residence. If this is not possible, they should stay  in another room, or be separated from the patient as much as possible. Use a separate bathroom, if available.  Restrict visitors who do not have an essential need to be in the home.  Keep older adults, very young children, and other sick people away from the patient Keep older adults, very young children, and those who have compromised immune systems or chronic health conditions away from the patient. This includes people with chronic heart, lung, or kidney conditions, diabetes, and cancer.  Ensure good ventilation Make sure that shared spaces in the home have good air flow, such as from an air conditioner or an opened window, weather permitting.  Wash your hands often  Wash your hands often and thoroughly with soap and water for at least 20 seconds. You can use an alcohol based hand sanitizer if soap and water are not available and if your hands are not visibly dirty.  Avoid touching your eyes, nose, and mouth with unwashed hands.  Use disposable paper towels to dry your hands. If not available, use dedicated cloth towels and replace them when they become wet.  Wear a facemask and gloves  Wear a disposable facemask at all times in the room and gloves when you touch or have contact  with the patient's blood, body fluids, and/or secretions or excretions, such as sweat, saliva, sputum, nasal mucus, vomit, urine, or feces.  Ensure the mask fits over your nose and mouth tightly, and do not touch it during use.  Throw out disposable facemasks and gloves after using them. Do not reuse.  Wash your hands immediately after removing your facemask and gloves.  If your personal clothing becomes contaminated, carefully remove clothing and launder. Wash your hands after handling contaminated clothing.  Place all used disposable facemasks, gloves, and other waste in a lined container before disposing them with other household waste.  Remove gloves and wash your hands immediately after handling these items.  Do not share dishes, glasses, or other household items with the patient  Avoid sharing household items. You should not share dishes, drinking glasses, cups, eating utensils, towels, bedding, or other items with a patient who is confirmed to have, or being evaluated for, COVID-19 infection.  After the person uses these items, you should wash them thoroughly with soap and water.  Wash laundry thoroughly  Immediately remove and wash clothes or bedding that have blood, body fluids, and/or secretions or excretions, such as sweat, saliva, sputum, nasal mucus, vomit, urine, or feces, on them.  Wear gloves when handling  laundry from the patient.  Read and follow directions on labels of laundry or clothing items and detergent. In general, wash and dry with the warmest temperatures recommended on the label.  Clean all areas the individual has used often  Clean all touchable surfaces, such as counters, tabletops, doorknobs, bathroom fixtures, toilets, phones, keyboards, tablets, and bedside tables, every day. Also, clean any surfaces that may have blood, body fluids, and/or secretions or excretions on them.  Wear gloves when cleaning surfaces the patient has come in contact with.  Use  a diluted bleach solution (e.g., dilute bleach with 1 part bleach and 10 parts water) or a household disinfectant with a label that says EPA-registered for coronaviruses. To make a bleach solution at home, add 1 tablespoon of bleach to 1 quart (4 cups) of water. For a larger supply, add  cup of bleach to 1 gallon (16 cups) of water.  Read labels of cleaning products and follow recommendations provided on product labels. Labels contain instructions for safe and effective use of the cleaning product including precautions you should take when applying the product, such as wearing gloves or eye protection and making sure you have good ventilation during use of the product.  Remove gloves and wash hands immediately after cleaning.  Monitor yourself for signs and symptoms of illness Caregivers and household members are considered close contacts, should monitor their health, and will be asked to limit movement outside of the home to the extent possible. Follow the monitoring steps for close contacts listed on the symptom monitoring form.   ? If you have additional questions, contact your local health department or call the epidemiologist on call at [phone] (available 24/7). ? This guidance is subject to change. For the most up-to-date guidance from [employer], please refer to their website: [email or web address]hu

## 2020-06-26 NOTE — Progress Notes (Signed)
E-Visit for Corona Virus Screening  Your current symptoms could be consistent with the coronavirus.  Many health care providers can now test patients at their office but not all are.  [facility] has multiple testing sites. For information on our COVID testing locations and hours go to [email or web address]  We are enrolling you in our MyChart Home Monitoring for COVID19 . Daily you will receive a questionnaire within the MyChart website. Our COVID 19 response team will be monitoring your responses daily.  Testing Information: The COVID-19 Community Testing sites are testing BY APPOINTMENT ONLY.  You can schedule online at [email or web address]  If you do not have access to a smart phone or computer you may call [phone] for an appointment.   Additional testing sites in the Community:  . For [facility] Testing sites in [place]  https://www.cvs.com/minuteclinic/covid-19-testing  . For Pop-up testing sites in [place]  https://covid19.ncdhhs.gov/about-covid-19/testing/find-my-testing-place/pop-testing-sites  . For [facility] https://[place].gov[place]/human-services/health-department/coronavirus-covid-19-info/covid-19-testing  . For [place] testing in [place] and [place] https://www.[place].gov/[place]/human-services/health-department/coronavirus-covid-19-info/covid-19-testing  . For [facility] testing in [place]   https://[facility].care/covidtesting  For  more information about community testing call [phone]   Please quarantine yourself while awaiting your test results. Please stay home for a minimum of 10 days from the first day of illness with improving symptoms and you have had 24 hours of no fever (without the use of Tylenol (Acetaminophen) Motrin (Ibuprofen) or any fever reducing medication).  Also - Do not get tested prior to returning to work because once you have had a positive test the test can stay  positive for more than a month in some cases.   You should wear a mask or cloth face covering over your nose and mouth if you must be around other people or animals, including pets (even at home). Try to stay at least 6 feet away from other people. This will protect the people around you.  Please continue good preventive care measures, including:  frequent hand-washing, avoid touching your face, cover coughs/sneezes, stay out of crowds and keep a 6 foot distance from others.  COVID-19 is a respiratory illness with symptoms that are similar to the flu. Symptoms are typically mild to moderate, but there have been cases of severe illness and death due to the virus.   The following symptoms may appear 2-14 days after exposure: . Fever . Cough . Shortness of breath or difficulty breathing . Chills . Repeated shaking with chills . Muscle pain . Headache . Sore throat . New loss of taste or smell . Fatigue . Congestion or runny nose . Nausea or vomiting . Diarrhea  Go to the nearest hospital ED for assessment if fever/cough/breathlessness are severe or illness seems like a threat to life.  It is vitally important that if you feel that you have an infection such as this virus or any other virus that you stay home and away from places where you may spread it to others.  You should avoid contact with people age 65 and older.   You can use medication such as A prescription cough medication called Tessalon Perles 100 mg. You may take 1-2 capsules every 8 hours as needed for cough  You may also take acetaminophen (Tylenol) as needed for fever.  Reduce your risk of any infection by using the same precautions used for avoiding the common cold or flu:  . Wash your hands often with soap and warm water for at least 20 seconds.  If soap and water are not   readily available, use an alcohol-based hand sanitizer with at least 60% alcohol.  . If coughing or sneezing, cover your mouth and nose by coughing or  sneezing into the elbow areas of your shirt or coat, into a tissue or into your sleeve (not your hands). . Avoid shaking hands with others and consider head nods or verbal greetings only. . Avoid touching your eyes, nose, or mouth with unwashed hands.  . Avoid close contact with people who are sick. . Avoid places or events with large numbers of people in one location, like concerts or sporting events. . Carefully consider travel plans you have or are making. . If you are planning any travel outside or inside the [place], visit the CDC's Travelers' Health webpage for the latest health notices. . If you have some symptoms but not all symptoms, continue to monitor at home and seek medical attention if your symptoms worsen. . If you are having a medical emergency, call 911.  HOME CARE . Only take medications as instructed by your medical team. . Drink plenty of fluids and get plenty of rest. . A steam or ultrasonic humidifier can help if you have congestion.   GET HELP RIGHT AWAY IF YOU HAVE EMERGENCY WARNING SIGNS** FOR COVID-19. If you or someone is showing any of these signs seek emergency medical care immediately. Call 911 or proceed to your closest emergency facility if: . You develop worsening high fever. . Trouble breathing . Bluish lips or face . Persistent pain or pressure in the chest . New confusion . Inability to wake or stay awake . You cough up blood. . Your symptoms become more severe  **This list is not all possible symptoms. Contact your medical provider for any symptoms that are sever or concerning to you.  MAKE SURE YOU   Understand these instructions.  Will watch your condition.  Will get help right away if you are not doing well or get worse.  Your e-visit answers were reviewed by a board certified advanced clinical practitioner to complete your personal care plan.  Depending on the condition, your plan could have included both over the counter or prescription  medications.  If there is a problem please reply once you have received a response from your provider.  Your safety is important to [place].  If you have drug allergies check your prescription carefully.    You can use MyChart to ask questions about today's visit, request a non-urgent call back, or ask for a work or school excuse for 24 hours related to this e-Visit. If it has been greater than 24 hours you will need to follow up with your provider, or enter a new e-Visit to address those concerns. You will get an e-mail in the next two days asking about your experience.  I hope that your e-visit has been valuable and will speed your recovery. Thank you for using e-visits.   **Please do not respond to this message unless you have follow up questions.** Greater than 5 but less than 10 minutes spent researching, coordinating, and implementing care for this patient today

## 2020-07-01 ENCOUNTER — Telehealth: Payer: Self-pay | Admitting: Nurse Practitioner

## 2020-07-01 DIAGNOSIS — J209 Acute bronchitis, unspecified: Secondary | ICD-10-CM

## 2020-07-01 NOTE — Telephone Encounter (Signed)
Patient is calling to see if she can get a school note. She was told to quaratine for 10 days and missed school. If approved, please send through MyChart and call her at [phone] to let her know that it has been sent to her MyChart.

## 2020-07-01 NOTE — Telephone Encounter (Signed)
LVM for patient to return call to confirm she is symptom free before clearing her to return to school.

## 2020-07-03 ENCOUNTER — Encounter: Payer: Self-pay | Admitting: Nurse Practitioner

## 2020-07-03 MED ORDER — METHYLPREDNISOLONE 4 MG PO TBPK: ORAL_TABLET | ORAL | 0 refills | Status: DC

## 2020-07-03 NOTE — Telephone Encounter (Signed)
Letter written Sent oral prednisone to treat acute bronchitis. Ask if she went for COVID test?

## 2020-07-03 NOTE — Telephone Encounter (Signed)
Pt states she was tested for COVID today and was informed results would be in 2 days.

## 2020-07-03 NOTE — Telephone Encounter (Signed)
Spoke with patient and she states she still has a productive cough and chest discomfort due to the cough. 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

## 2020-08-06 ENCOUNTER — Telehealth: Payer: BC Managed Care – PPO | Admitting: Physician Assistant

## 2020-08-06 DIAGNOSIS — J069 Acute upper respiratory infection, unspecified: Secondary | ICD-10-CM | POA: Diagnosis not present

## 2020-08-06 MED ORDER — BENZONATATE 100 MG PO CAPS: 100.0000 mg | ORAL_CAPSULE | Freq: Three times a day (TID) | ORAL | 0 refills | Status: DC | PRN

## 2020-08-06 MED ORDER — ALBUTEROL SULFATE HFA 108 (90 BASE) MCG/ACT IN AERS: 1.0000 | INHALATION_SPRAY | Freq: Four times a day (QID) | RESPIRATORY_TRACT | 0 refills | Status: AC | PRN

## 2020-08-06 NOTE — Progress Notes (Signed)
We are sorry that you are not feeling well.  Here is how we plan to help!  Based on your presentation I believe you most likely have A cough due to a virus.  This is called viral bronchitis and is best treated by rest, plenty of fluids and control of the cough.  You may use Ibuprofen or Tylenol as directed to help your symptoms.     In addition you may use A prescription cough medication called Tessalon Perles 100mg . You may take 1-2 capsules every 8 hours as needed for your cough.  An albuterol inhaler every 4-6 hours as needed   From your responses in the eVisit questionnaire you describe inflammation in the upper respiratory tract which is causing a significant cough.  This is commonly called Bronchitis and has four common causes:    Allergies  Viral Infections  Acid Reflux  Bacterial Infection Allergies, viruses and acid reflux are treated by controlling symptoms or eliminating the cause. An example might be a cough caused by taking certain blood pressure medications. You stop the cough by changing the medication. Another example might be a cough caused by acid reflux. Controlling the reflux helps control the cough.  USE OF BRONCHODILATOR ("RESCUE") INHALERS: There is a risk from using your bronchodilator too frequently.  The risk is that over-reliance on a medication which only relaxes the muscles surrounding the breathing tubes can reduce the effectiveness of medications prescribed to reduce swelling and congestion of the tubes themselves.  Although you feel brief relief from the bronchodilator inhaler, your asthma may actually be worsening with the tubes becoming more swollen and filled with mucus.  This can delay other crucial treatments, such as oral steroid medications. If you need to use a bronchodilator inhaler daily, several times per day, you should discuss this with your provider.  There are probably better treatments that could be used to keep your asthma under control.     HOME  CARE . Only take medications as instructed by your medical team. . Complete the entire course of an antibiotic. . Drink plenty of fluids and get plenty of rest. . Avoid close contacts especially the very young and the elderly . Cover your mouth if you cough or cough into your sleeve. . Always remember to wash your hands . A steam or ultrasonic humidifier can help congestion.   GET HELP RIGHT AWAY IF: . You develop worsening fever. . You become short of breath . You cough up blood. . Your symptoms persist after you have completed your treatment plan MAKE SURE YOU   Understand these instructions.  Will watch your condition.  Will get help right away if you are not doing well or get worse.  Your e-visit answers were reviewed by a board certified advanced clinical practitioner to complete your personal care plan.  Depending on the condition, your plan could have included both over the counter or prescription medications. If there is a problem please reply  once you have received a response from your provider. Your safety is important to .  If you have drug allergies check your prescription carefully.    You can use MyChart to ask questions about today's visit, request a non-urgent call back, or ask for a work or school excuse for 24 hours related to this e-Visit. If it has been greater than 24 hours you will need to follow up with your provider, or enter a new e-Visit to address those concerns. You will get an e-mail in the  next two days asking about your experience.  I hope that your e-visit has been valuable and will speed your recovery. Thank you for using e-visits.  Greater than 5 minutes, yet less than 10 minutes of time have been spent researching, coordinating, and implementing care for this patient today.  Jarold Motto PA-C

## 2020-11-23 ENCOUNTER — Other Ambulatory Visit: Payer: Self-pay

## 2020-11-24 ENCOUNTER — Ambulatory Visit (INDEPENDENT_AMBULATORY_CARE_PROVIDER_SITE_OTHER): Payer: BC Managed Care – PPO | Admitting: Nurse Practitioner

## 2020-11-24 ENCOUNTER — Encounter: Payer: Self-pay | Admitting: Nurse Practitioner

## 2020-11-24 VITALS — BP 100/70 | HR 76 | Temp 97.8°F | Ht 65.75 in | Wt 120.6 lb

## 2020-11-24 DIAGNOSIS — G43709 Chronic migraine without aura, not intractable, without status migrainosus: Secondary | ICD-10-CM | POA: Diagnosis not present

## 2020-11-24 MED ORDER — RIZATRIPTAN BENZOATE 5 MG PO TABS: 5.0000 mg | ORAL_TABLET | ORAL | 0 refills | Status: DC | PRN

## 2020-11-24 MED ORDER — PROMETHAZINE HCL 12.5 MG PO TABS: 12.5000 mg | ORAL_TABLET | Freq: Three times a day (TID) | ORAL | 0 refills | Status: DC | PRN

## 2020-11-24 NOTE — Patient Instructions (Signed)

## 2020-11-24 NOTE — Progress Notes (Signed)
Subjective:  Patient ID: Rita Mcintosh, female    DOB: 10-Oct-2001  Age: 20 y.o. MRN: [id number]  CC: Establish Care (New patient/Pt c/o migraines since childhood (age 71), worse in the past month. Pt has tried otc ibuprofen, alieve, and tylenol with no relief. Declines flu vaccine. )  Migraine  This is a chronic problem. The current episode started more than 1 year ago (onset at age 3). The problem occurs constantly. The problem has been waxing and waning. The pain is located in the occipital and frontal region. The pain does not radiate. The pain quality is similar to prior headaches. The quality of the pain is described as aching and dull. The pain is severe. Associated symptoms include eye pain, nausea and photophobia. Pertinent negatives include no abdominal pain, abnormal behavior, anorexia, back pain, blurred vision, coughing, dizziness, drainage, ear pain, eye redness, eye watering, facial sweating, fever, hearing loss, insomnia, loss of balance, muscle aches, neck pain, numbness, phonophobia, rhinorrhea, scalp tenderness, seizures, sinus pressure, sore throat, swollen glands, tingling, tinnitus, visual change, vomiting, weakness or weight loss. The symptoms are aggravated by bright light and emotional stress. She has tried Excedrin, acetaminophen, NSAIDs and darkened room for the symptoms. The treatment provided mild relief. Her past medical history is significant for migraine headaches. There is no history of cancer, cluster headaches, hypertension, immunosuppression, migraines in the family, obesity, pseudotumor cerebri, recent head traumas, sinus disease or TMJ.   Reviewed past Medical, Social and Family history today.  Outpatient Medications Prior to Visit  Medication Sig Dispense Refill  . albuterol (VENTOLIN HFA) 108 (90 Base) MCG/ACT inhaler Inhale 1-2 puffs into the lungs every 6 (six) hours as needed for wheezing or shortness of breath. 6.7 g 0  . benzonatate (TESSALON PERLES) 100 MG  capsule Take 1 capsule (100 mg total) by mouth 3 (three) times daily as needed for cough (cough). (Patient not taking: Reported on 11/24/2020) 20 capsule 0  . methylPREDNISolone (MEDROL DOSEPAK) 4 MG TBPK tablet Take as directed on package (Patient not taking: Reported on 11/24/2020) 21 tablet 0  . MICROGESTIN 1-20 MG-MCG tablet Take 1 tablet by mouth daily. (Patient not taking: No sig reported)     No facility-administered medications prior to visit.    ROS See HPI  Objective:  BP 100/70 (BP Location: Left Arm, Patient Position: Sitting, Cuff Size: Normal)   Pulse 76   Temp 97.8 F (36.6 C) (Temporal)   Ht 5' 5.75" (1.67 m)   Wt 120 lb 9.6 oz (54.7 kg)   SpO2 98%   BMI 19.61 kg/m   Physical Exam Vitals reviewed.  HENT:     Right Ear: Tympanic membrane, ear canal and external ear normal.     Left Ear: Tympanic membrane, ear canal and external ear normal.  Eyes:     Extraocular Movements: Extraocular movements intact.     Conjunctiva/sclera: Conjunctivae normal.  Cardiovascular:     Rate and Rhythm: Normal rate and regular rhythm.     Pulses: Normal pulses.     Heart sounds: Normal heart sounds.  Musculoskeletal:     Cervical back: Normal range of motion and neck supple.  Lymphadenopathy:     Cervical: No cervical adenopathy.  Neurological:     Mental Status: She is alert and oriented to person, place, and time.     Cranial Nerves: No cranial nerve deficit.    Assessment & Plan:  This visit occurred during the SARS-CoV-2 public health emergency.  Safety protocols were in place,  including screening questions prior to the visit, additional usage of staff PPE, and extensive cleaning of exam room while observing appropriate contact time as indicated for disinfecting solutions.   Teresia was seen today for establish care.  Diagnoses and all orders for this visit:  Chronic migraine without aura without status migrainosus, not intractable -     rizatriptan (MAXALT) 5 MG  tablet; Take 1 tablet (5 mg total) by mouth as needed for migraine. May repeat in 2 hours if needed. No more than 30mg  in 24hrs    Problem List Items Addressed This Visit   None   Visit Diagnoses    Chronic migraine without aura without status migrainosus, not intractable    -  Primary   Relevant Medications   rizatriptan (MAXALT) 5 MG tablet      Follow-up: Return in about 4 weeks (around 12/22/2020) for CPE and migraine f/up (fasting).  12/24/2020, NP

## 2020-11-24 NOTE — Assessment & Plan Note (Addendum)
Onset at age 20 per patient, daily, trigger: bright light and stress. Associated with photophobia and nausea. No improvement with use of bluelight screen, NSAIDs, tylenol and excedrin. No change in characteristics (aching, dull, frontal and behind eyes)  maxalt and promethazine rx sent F/up in 89month

## 2020-12-08 NOTE — Telephone Encounter (Signed)
Pt called about this and asked if she can have an appt sooner just to discuss because she needs it turned in sooner

## 2020-12-09 NOTE — Telephone Encounter (Signed)
yes

## 2020-12-11 ENCOUNTER — Telehealth: Payer: Self-pay | Admitting: Nurse Practitioner

## 2020-12-11 DIAGNOSIS — G43719 Chronic migraine without aura, intractable, without status migrainosus: Secondary | ICD-10-CM

## 2020-12-11 MED ORDER — AMITRIPTYLINE HCL 10 MG PO TABS: 10.0000 mg | ORAL_TABLET | Freq: Every day | ORAL | 5 refills | Status: DC

## 2020-12-11 NOTE — Assessment & Plan Note (Signed)
Chronic but worse in last 2-3yrs. persistent migraine episodes every other day, varies from mild to severe, use of OTC medications for mild episodes and maxalt 1-2tabs for severe episodes. maxalt provides significant relief but she has to sleep for a few hours. Current use of COC x 13yrs, prescribed by GYN She agreed to start elavil 10mg  at [facility] and go for head CT. Maintain appt on 12/30/20 for CPE and migraine re eval. Consider changing COC to progesterone only if no improvement

## 2020-12-11 NOTE — Telephone Encounter (Signed)
Rita Mcintosh reports persistent migraine episodes every other day, varies from mild to severe, use of OTC medications for mild episodes and maxalt 1-2tabs for severe episodes. maxalt provides significant relief but she has to sleep for a few hours. Current use of COC x 89yrs, prescribed by GYN She agreed to start elavil 10mg  at [facility] and go for head CT. Maintain appt on 12/30/20 for CPE and migraine re eval.

## 2020-12-11 NOTE — Telephone Encounter (Signed)
Pt called back and Claris Gower was on the phone so I sent secure chat and let pt know she would receive a call back at [phone]

## 2020-12-11 NOTE — Addendum Note (Signed)
Addended by: Alysia Penna L on: 12/11/2020 12:58 PM   Modules accepted: Orders

## 2020-12-11 NOTE — Telephone Encounter (Signed)
Called to discuss FMLA form. She stated she will have to call back because she was at work. I provided the number [phone] to call back.

## 2020-12-16 ENCOUNTER — Ambulatory Visit: Payer: BC Managed Care – PPO | Admitting: Nurse Practitioner

## 2020-12-30 ENCOUNTER — Encounter: Payer: BC Managed Care – PPO | Admitting: Nurse Practitioner

## 2020-12-30 ENCOUNTER — Encounter: Payer: Self-pay | Admitting: Nurse Practitioner

## 2021-01-08 ENCOUNTER — Other Ambulatory Visit (HOSPITAL_BASED_OUTPATIENT_CLINIC_OR_DEPARTMENT_OTHER): Payer: Self-pay | Admitting: Emergency Medicine

## 2021-01-08 ENCOUNTER — Emergency Department (HOSPITAL_BASED_OUTPATIENT_CLINIC_OR_DEPARTMENT_OTHER)
Admission: EM | Admit: 2021-01-08 | Discharge: 2021-01-08 | Disposition: A | Discharge status: Home / Self Care | Payer: BC Managed Care – PPO | Attending: Emergency Medicine | Admitting: Emergency Medicine

## 2021-01-08 ENCOUNTER — Ambulatory Visit (HOSPITAL_BASED_OUTPATIENT_CLINIC_OR_DEPARTMENT_OTHER): Admit: 2021-01-08 | Payer: BC Managed Care – PPO

## 2021-01-08 ENCOUNTER — Encounter (HOSPITAL_BASED_OUTPATIENT_CLINIC_OR_DEPARTMENT_OTHER): Payer: Self-pay | Admitting: Emergency Medicine

## 2021-01-08 ENCOUNTER — Ambulatory Visit (HOSPITAL_BASED_OUTPATIENT_CLINIC_OR_DEPARTMENT_OTHER)
Admission: RE | Admit: 2021-01-08 | Discharge: 2021-01-08 | Disposition: A | Discharge status: Home / Self Care | Payer: BC Managed Care – PPO | Source: Ambulatory Visit | Attending: Emergency Medicine | Admitting: Emergency Medicine

## 2021-01-08 ENCOUNTER — Other Ambulatory Visit: Payer: Self-pay

## 2021-01-08 DIAGNOSIS — R102 Pelvic and perineal pain: Secondary | ICD-10-CM | POA: Insufficient documentation

## 2021-01-08 DIAGNOSIS — O26891 Other specified pregnancy related conditions, first trimester: Secondary | ICD-10-CM | POA: Insufficient documentation

## 2021-01-08 DIAGNOSIS — Z3A08 8 weeks gestation of pregnancy: Secondary | ICD-10-CM | POA: Diagnosis not present

## 2021-01-08 DIAGNOSIS — O26899 Other specified pregnancy related conditions, unspecified trimester: Secondary | ICD-10-CM

## 2021-01-08 DIAGNOSIS — J45909 Unspecified asthma, uncomplicated: Secondary | ICD-10-CM | POA: Insufficient documentation

## 2021-01-08 LAB — URINALYSIS, ROUTINE W REFLEX MICROSCOPIC
Bilirubin Urine: NEGATIVE
Glucose, UA: NEGATIVE mg/dL
Hgb urine dipstick: NEGATIVE
Ketones, ur: NEGATIVE mg/dL
Leukocytes,Ua: NEGATIVE
Nitrite: NEGATIVE
Protein, ur: NEGATIVE mg/dL
Specific Gravity, Urine: 1.02 (ref 1.005–1.030)
pH: 6.5 (ref 5.0–8.0)

## 2021-01-08 LAB — HCG, QUANTITATIVE, PREGNANCY: hCG, Beta Chain, Quant, S: 2944 m[IU]/mL — ABNORMAL HIGH (ref <5)

## 2021-01-08 LAB — ABO/RH: ABO/RH(D): B POS

## 2021-01-08 LAB — PREGNANCY, URINE: Preg Test, Ur: NEGATIVE

## 2021-01-08 MED ORDER — ACETAMINOPHEN 325 MG PO TABS
650.0000 mg | ORAL_TABLET | Freq: Once | ORAL | Status: AC
  Administered 2021-01-08: 650 mg via ORAL
  Filled 2021-01-08: qty 2

## 2021-01-08 NOTE — ED Triage Notes (Signed)
Pt c/o lower abd pain with vomiting.  

## 2021-01-08 NOTE — ED Provider Notes (Signed)
Return this morning for a pelvic ultrasound which showed small volume of pelvic fluid but no adnexal mass and no intrauterine pregnancy.  Review of her labs showed beta hCG that was 2000.  She will need this to be trended within the next 2 days.  Advised return for repeat beta-hCG levels.  Advised follow-up with OB/GYN within the next 2 to 4 days.     Cheryll Cockayne, MD 01/08/21 1218

## 2021-01-08 NOTE — ED Provider Notes (Signed)
[facility] Provider Note: [name], MD, FACEP  CSN: [id number] MRN: [id number] ARRIVAL: 01/08/21 at 0336 ROOM: MH01/[facility]01   CHIEF COMPLAINT  Abdominal Pain   HISTORY OF PRESENT ILLNESS  01/08/21 3:51 AM Rita Mcintosh is a 20 y.o. female who is about [redacted] weeks pregnant.  She is here with 2 months of pelvic pain which she describes as sharp it worsened since yesterday and she now rates it an 8 out of 10.  It is worse with movement.  She denies any vaginal bleeding or discharge.  She denies any dysuria or hematuria.  She denies any constipation or diarrhea.  She denies any fever.  She has had nausea off and on which she attributes to the pregnancy.   Past Medical History:  Diagnosis Date  . Asthma   . Depression   . Headache   . Migraines     History reviewed. No pertinent surgical history.  Family History  Problem Relation Age of Onset  . Myasthenia gravis Father   . Myasthenia gravis Brother   . Myasthenia gravis Paternal Grandfather     Social History   Tobacco Use  . Smoking status: Never Smoker  . Smokeless tobacco: Never Used  Substance Use Topics  . Alcohol use: Never  . Drug use: Never    Prior to Admission medications   Medication Sig Start Date End Date Taking? Authorizing Provider  albuterol (VENTOLIN HFA) 108 (90 Base) MCG/ACT inhaler Inhale 1-2 puffs into the lungs every 6 (six) hours as needed for wheezing or shortness of breath. 08/06/20   Jarold Motto, PA  amitriptyline (ELAVIL) 10 MG tablet Take 1 tablet (10 mg total) by mouth at bedtime. 12/11/20   Nche, Bonna Gains, NP  promethazine (PHENERGAN) 12.5 MG tablet Take 1 tablet (12.5 mg total) by mouth every 8 (eight) hours as needed for nausea or vomiting. 11/24/20   Nche, Bonna Gains, NP  rizatriptan (MAXALT) 5 MG tablet Take 1 tablet (5 mg total) by mouth as needed for migraine. May repeat in 2 hours if needed. No more than 30mg  in 24hrs 11/24/20   Nche, 11/26/20, NP     Allergies Patient has no known allergies.   REVIEW OF SYSTEMS  Negative except as noted here or in the History of Present Illness.   PHYSICAL EXAMINATION  Initial Vital Signs Blood pressure 109/72, pulse 90, temperature 98.4 F (36.9 C), temperature source Oral, resp. rate 16, height 5' 5.75" (1.67 m), weight 61.2 kg, last menstrual period 10/29/2020, SpO2 99 %.  Examination General: Well-developed, well-nourished female in no acute distress; appearance consistent with age of record HENT: normocephalic; atraumatic Eyes: pupils equal, round and reactive to light; extraocular muscles intact Neck: supple Heart: regular rate and rhythm Lungs: clear to auscultation bilaterally Abdomen: soft; nondistended; nontender; no masses or hepatosplenomegaly; bowel sounds present Extremities: No deformity; full range of motion; pulses normal Neurologic: Awake, alert and oriented; motor function intact in all extremities and symmetric; no facial droop Skin: Warm and dry Psychiatric: Normal mood and affect   RESULTS  Summary of this visit's results, reviewed and interpreted by myself:   EKG Interpretation  Date/Time:    Ventricular Rate:    PR Interval:    QRS Duration:   QT Interval:    QTC Calculation:   R Axis:     Text Interpretation:        Laboratory Studies: Results for orders placed or performed during the hospital encounter of 01/08/21 (from the past 24  hour(s))  Urinalysis, Routine w reflex microscopic Urine, Clean Catch     Status: None   Collection Time: 01/08/21  3:49 AM  Result Value Ref Range   Color, Urine YELLOW YELLOW   APPearance CLEAR CLEAR   Specific Gravity, Urine 1.020 1.005 - 1.030   pH 6.5 5.0 - 8.0   Glucose, UA NEGATIVE NEGATIVE mg/dL   Hgb urine dipstick NEGATIVE NEGATIVE   Bilirubin Urine NEGATIVE NEGATIVE   Ketones, ur NEGATIVE NEGATIVE mg/dL   Protein, ur NEGATIVE NEGATIVE mg/dL   Nitrite NEGATIVE NEGATIVE   Leukocytes,Ua NEGATIVE NEGATIVE   Pregnancy, urine     Status: None   Collection Time: 01/08/21  3:49 AM  Result Value Ref Range   Preg Test, Ur NEGATIVE NEGATIVE  hCG, quantitative, pregnancy     Status: Abnormal   Collection Time: 01/08/21  4:17 AM  Result Value Ref Range   hCG, Beta Chain, Quant, S 2,944 (H) <5 mIU/mL   Imaging Studies: No results found.  ED COURSE and MDM  Nursing notes, initial and subsequent vitals signs, including pulse oximetry, reviewed and interpreted by myself.  Vitals:   01/08/21 0343 01/08/21 0457  BP: 109/72 100/64  Pulse: 90 80  Resp: 16 16  Temp: 98.4 F (36.9 C) 98.5 F (36.9 C)  TempSrc: Oral Oral  SpO2: 99% 100%  Weight: 61.2 kg   Height: 5' 5.75" (1.67 m)    Medications  acetaminophen (TYLENOL) tablet 650 mg (650 mg Oral Given 01/08/21 0413)   The patient's quantitative beta-hCG is 2944, the negative pregnancy test is likely a false negative.  We will have the patient return later this morning for a pelvic ultrasound to assess viability.   PROCEDURES  Procedures   ED DIAGNOSES     ICD-10-CM   1. Pelvic pain affecting pregnancy in first trimester, antepartum  O26.891    R10.2        Vienna Folden, MD 01/08/21 [phone]

## 2021-01-08 NOTE — ED Notes (Signed)
Patient instructed to return at 1045am for outpatient ultrasound

## 2021-02-25 ENCOUNTER — Encounter: Payer: Self-pay | Admitting: Nurse Practitioner

## 2021-10-13 DIAGNOSIS — Z3689 Encounter for other specified antenatal screening: Secondary | ICD-10-CM | POA: Diagnosis not present

## 2021-10-13 DIAGNOSIS — Z3A18 18 weeks gestation of pregnancy: Secondary | ICD-10-CM | POA: Diagnosis not present

## 2021-10-13 DIAGNOSIS — O283 Abnormal ultrasonic finding on antenatal screening of mother: Secondary | ICD-10-CM | POA: Diagnosis not present

## 2021-11-02 DIAGNOSIS — R609 Edema, unspecified: Secondary | ICD-10-CM | POA: Diagnosis not present

## 2021-11-24 DIAGNOSIS — Z3A24 24 weeks gestation of pregnancy: Secondary | ICD-10-CM | POA: Diagnosis not present

## 2021-11-24 DIAGNOSIS — N898 Other specified noninflammatory disorders of vagina: Secondary | ICD-10-CM | POA: Diagnosis not present

## 2021-11-24 DIAGNOSIS — R109 Unspecified abdominal pain: Secondary | ICD-10-CM | POA: Diagnosis not present

## 2021-11-24 DIAGNOSIS — O26892 Other specified pregnancy related conditions, second trimester: Secondary | ICD-10-CM | POA: Diagnosis not present

## 2021-12-08 DIAGNOSIS — O321XX0 Maternal care for breech presentation, not applicable or unspecified: Secondary | ICD-10-CM | POA: Diagnosis not present

## 2021-12-08 DIAGNOSIS — M549 Dorsalgia, unspecified: Secondary | ICD-10-CM | POA: Diagnosis not present

## 2021-12-08 DIAGNOSIS — O35BXX0 Maternal care for other (suspected) fetal abnormality and damage, fetal cardiac anomalies, not applicable or unspecified: Secondary | ICD-10-CM | POA: Diagnosis not present

## 2021-12-08 DIAGNOSIS — Z3689 Encounter for other specified antenatal screening: Secondary | ICD-10-CM | POA: Diagnosis not present

## 2021-12-08 DIAGNOSIS — Z3A26 26 weeks gestation of pregnancy: Secondary | ICD-10-CM | POA: Diagnosis not present

## 2021-12-08 DIAGNOSIS — O99891 Other specified diseases and conditions complicating pregnancy: Secondary | ICD-10-CM | POA: Diagnosis not present

## 2022-02-12 ENCOUNTER — Emergency Department (HOSPITAL_BASED_OUTPATIENT_CLINIC_OR_DEPARTMENT_OTHER)
Admission: EM | Admit: 2022-02-12 | Discharge: 2022-02-12 | Disposition: A | Discharge status: Other Acute Inpatient Hospital | Payer: BC Managed Care – PPO | Attending: Emergency Medicine | Admitting: Emergency Medicine

## 2022-02-12 ENCOUNTER — Other Ambulatory Visit: Payer: Self-pay

## 2022-02-12 ENCOUNTER — Encounter (HOSPITAL_BASED_OUTPATIENT_CLINIC_OR_DEPARTMENT_OTHER): Payer: Self-pay | Admitting: Emergency Medicine

## 2022-02-12 DIAGNOSIS — O26893 Other specified pregnancy related conditions, third trimester: Secondary | ICD-10-CM | POA: Diagnosis not present

## 2022-02-12 DIAGNOSIS — O43893 Other placental disorders, third trimester: Secondary | ICD-10-CM | POA: Diagnosis not present

## 2022-02-12 DIAGNOSIS — I493 Ventricular premature depolarization: Secondary | ICD-10-CM | POA: Diagnosis not present

## 2022-02-12 DIAGNOSIS — D649 Anemia, unspecified: Secondary | ICD-10-CM | POA: Diagnosis not present

## 2022-02-12 DIAGNOSIS — O364XX0 Maternal care for intrauterine death, not applicable or unspecified: Secondary | ICD-10-CM | POA: Diagnosis not present

## 2022-02-12 DIAGNOSIS — E559 Vitamin D deficiency, unspecified: Secondary | ICD-10-CM | POA: Diagnosis not present

## 2022-02-12 DIAGNOSIS — Z3A35 35 weeks gestation of pregnancy: Secondary | ICD-10-CM | POA: Diagnosis not present

## 2022-02-12 DIAGNOSIS — R030 Elevated blood-pressure reading, without diagnosis of hypertension: Secondary | ICD-10-CM | POA: Insufficient documentation

## 2022-02-12 DIAGNOSIS — O1414 Severe pre-eclampsia complicating childbirth: Secondary | ICD-10-CM | POA: Diagnosis not present

## 2022-02-12 DIAGNOSIS — R0602 Shortness of breath: Secondary | ICD-10-CM | POA: Diagnosis not present

## 2022-02-12 DIAGNOSIS — F419 Anxiety disorder, unspecified: Secondary | ICD-10-CM | POA: Diagnosis not present

## 2022-02-12 DIAGNOSIS — O479 False labor, unspecified: Secondary | ICD-10-CM | POA: Diagnosis not present

## 2022-02-12 DIAGNOSIS — R1030 Lower abdominal pain, unspecified: Secondary | ICD-10-CM | POA: Insufficient documentation

## 2022-02-12 DIAGNOSIS — D62 Acute posthemorrhagic anemia: Secondary | ICD-10-CM | POA: Diagnosis not present

## 2022-02-12 DIAGNOSIS — O99284 Endocrine, nutritional and metabolic diseases complicating childbirth: Secondary | ICD-10-CM | POA: Diagnosis not present

## 2022-02-12 DIAGNOSIS — N179 Acute kidney failure, unspecified: Secondary | ICD-10-CM | POA: Diagnosis not present

## 2022-02-12 DIAGNOSIS — O9081 Anemia of the puerperium: Secondary | ICD-10-CM | POA: Diagnosis not present

## 2022-02-12 DIAGNOSIS — O021 Missed abortion: Secondary | ICD-10-CM | POA: Diagnosis not present

## 2022-02-12 DIAGNOSIS — O4413 Placenta previa with hemorrhage, third trimester: Secondary | ICD-10-CM | POA: Diagnosis not present

## 2022-02-12 DIAGNOSIS — I10 Essential (primary) hypertension: Secondary | ICD-10-CM | POA: Diagnosis not present

## 2022-02-12 DIAGNOSIS — O364XX1 Maternal care for intrauterine death, fetus 1: Secondary | ICD-10-CM | POA: Diagnosis not present

## 2022-02-12 DIAGNOSIS — E538 Deficiency of other specified B group vitamins: Secondary | ICD-10-CM | POA: Diagnosis not present

## 2022-02-12 DIAGNOSIS — E875 Hyperkalemia: Secondary | ICD-10-CM | POA: Diagnosis not present

## 2022-02-12 DIAGNOSIS — O45023 Premature separation of placenta with disseminated intravascular coagulation, third trimester: Secondary | ICD-10-CM | POA: Diagnosis not present

## 2022-02-12 DIAGNOSIS — D689 Coagulation defect, unspecified: Secondary | ICD-10-CM | POA: Diagnosis not present

## 2022-02-12 DIAGNOSIS — E871 Hypo-osmolality and hyponatremia: Secondary | ICD-10-CM | POA: Diagnosis not present

## 2022-02-12 DIAGNOSIS — O4593 Premature separation of placenta, unspecified, third trimester: Secondary | ICD-10-CM | POA: Diagnosis not present

## 2022-02-12 DIAGNOSIS — O99892 Other specified diseases and conditions complicating childbirth: Secondary | ICD-10-CM | POA: Diagnosis not present

## 2022-02-12 DIAGNOSIS — O43813 Placental infarction, third trimester: Secondary | ICD-10-CM | POA: Diagnosis not present

## 2022-02-12 DIAGNOSIS — O99344 Other mental disorders complicating childbirth: Secondary | ICD-10-CM | POA: Diagnosis not present

## 2022-02-12 LAB — COMPREHENSIVE METABOLIC PANEL
ALT: 16 U/L (ref 0–44)
AST: 28 U/L (ref 15–41)
Albumin: 3 g/dL — ABNORMAL LOW (ref 3.5–5.0)
Alkaline Phosphatase: 165 U/L — ABNORMAL HIGH (ref 38–126)
Anion gap: 9 (ref 5–15)
BUN: 15 mg/dL (ref 6–20)
CO2: 19 mmol/L — ABNORMAL LOW (ref 22–32)
Calcium: 8.6 mg/dL — ABNORMAL LOW (ref 8.9–10.3)
Chloride: 107 mmol/L (ref 98–111)
Creatinine, Ser: 1.18 mg/dL — ABNORMAL HIGH (ref 0.44–1.00)
GFR, Estimated: >60 mL/min (ref >60)
Glucose, Bld: 88 mg/dL (ref 70–99)
Potassium: 4.4 mmol/L (ref 3.5–5.1)
Sodium: 135 mmol/L (ref 135–145)
Total Bilirubin: 1.1 mg/dL (ref 0.3–1.2)
Total Protein: 6.4 g/dL — ABNORMAL LOW (ref 6.5–8.1)

## 2022-02-12 LAB — CBC WITH DIFFERENTIAL/PLATELET
Abs Immature Granulocytes: 0.13 10*3/uL — ABNORMAL HIGH (ref 0.00–0.07)
Basophils Absolute: 0.1 10*3/uL (ref 0.0–0.1)
Basophils Relative: 0 %
Eosinophils Absolute: 0 10*3/uL (ref 0.0–0.5)
Eosinophils Relative: 0 %
HCT: 33.8 % — ABNORMAL LOW (ref 36.0–46.0)
Hemoglobin: 11 g/dL — ABNORMAL LOW (ref 12.0–15.0)
Immature Granulocytes: 1 %
Lymphocytes Relative: 12 %
Lymphs Abs: 1.9 10*3/uL (ref 0.7–4.0)
MCH: 26.7 pg (ref 26.0–34.0)
MCHC: 32.5 g/dL (ref 30.0–36.0)
MCV: 82 fL (ref 80.0–100.0)
Monocytes Absolute: 1.2 10*3/uL — ABNORMAL HIGH (ref 0.1–1.0)
Monocytes Relative: 7 %
Neutro Abs: 13.6 10*3/uL — ABNORMAL HIGH (ref 1.7–7.7)
Neutrophils Relative %: 80 %
Platelets: 179 10*3/uL (ref 150–400)
RBC: 4.12 MIL/uL (ref 3.87–5.11)
RDW: 12.8 % (ref 11.5–15.5)
WBC: 16.9 10*3/uL — ABNORMAL HIGH (ref 4.0–10.5)
nRBC: 0 % (ref 0.0–0.2)

## 2022-02-12 MED ORDER — SODIUM CHLORIDE 0.9 % IV BOLUS: 1000.0000 mL | Freq: Once | INTRAVENOUS | Status: DC

## 2022-02-12 NOTE — ED Provider Notes (Addendum)
?[facility] EMERGENCY DEPARTMENT ?Provider Note ? ? ?CSN: [id number] ?Arrival date & time: 02/12/22  1544 ? ?  ? ?History ? ?Chief Complaint  ?Patient presents with  ? Abdominal Pain  ? 35 wks preg  ? ? ?Rita Mcintosh is a 21 y.o. female. ? ?Level 5 caveat for acute of condition.  Patient is G1, P0 at [redacted] weeks gestation here with lower abdominal pain and cramping that onset about 1 hour ago.  States she had some diarrhea and now she has lower abdominal cramping that feels like a "period cramps".  Denies any gush of fluid or vaginal bleeding.  Denies any preceding problems with this pregnancy.  She follows with Dr. Allena Katz at [facility]. ?No pain with urination or blood in the urine.  States no problems with blood pressure during this pregnancy that was hypertensive on arrival.  States she has decreased fetal movement today. ? ?The history is provided by the patient. The history is limited by the condition of the patient.  ?Abdominal Pain ?Associated symptoms: no chest pain, no cough, no dysuria, no fever, no nausea, no shortness of breath, no vaginal bleeding and no vomiting   ? ?  ? ?Home Medications ?Prior to Admission medications   ?Medication Sig Start Date End Date Taking? Authorizing Provider  ?albuterol (VENTOLIN HFA) 108 (90 Base) MCG/ACT inhaler Inhale 1-2 puffs into the lungs every 6 (six) hours as needed for wheezing or shortness of breath. 08/06/20   Jarold Motto, PA  ?amitriptyline (ELAVIL) 10 MG tablet Take 1 tablet (10 mg total) by mouth at bedtime. 12/11/20   Nche, Bonna Gains, NP  ?promethazine (PHENERGAN) 12.5 MG tablet Take 1 tablet (12.5 mg total) by mouth every 8 (eight) hours as needed for nausea or vomiting. 11/24/20   Nche, Bonna Gains, NP  ?rizatriptan (MAXALT) 5 MG tablet Take 1 tablet (5 mg total) by mouth as needed for migraine. May repeat in 2 hours if needed. No more than 30mg  in 24hrs 11/24/20   Nche, 11/26/20, NP  ?   ? ?Allergies    ?Patient has no known allergies.    ? ?Review of Systems   ?Review of Systems  ?Constitutional:  Negative for activity change, appetite change and fever.  ?HENT:  Negative for congestion and rhinorrhea.   ?Respiratory:  Negative for cough, chest tightness and shortness of breath.   ?Cardiovascular:  Negative for chest pain.  ?Gastrointestinal:  Positive for abdominal pain. Negative for nausea and vomiting.  ?Genitourinary:  Positive for vaginal pain. Negative for dysuria and vaginal bleeding.  ?Musculoskeletal:  Negative for back pain.  ?Skin:  Negative for rash.  ?Neurological:  Negative for dizziness, weakness and headaches.  ? all other systems are negative except as noted in the HPI and PMH.  ? ?Physical Exam ?Updated Vital Signs ?BP (!) 152/110 (BP Location: Left Arm)   Pulse 90   Temp 98.3 ?F (36.8 ?C)   Resp 18   Ht 5\' 6"  (1.676 m)   Wt 81.6 kg   LMP 06/05/2021   SpO2 100%   BMI 29.05 kg/m?  ?Physical Exam ?Vitals and nursing note reviewed.  ?Constitutional:   ?   General: She is not in acute distress. ?   Appearance: She is well-developed.  ?HENT:  ?   Head: Normocephalic and atraumatic.  ?   Mouth/Throat:  ?   Pharynx: No oropharyngeal exudate.  ?Eyes:  ?   Conjunctiva/sclera: Conjunctivae normal.  ?   Pupils: Pupils are equal, round,  and reactive to light.  ?Neck:  ?   Comments: No meningismus. ?Cardiovascular:  ?   Rate and Rhythm: Normal rate and regular rhythm.  ?   Heart sounds: Normal heart sounds. No murmur heard. ?Pulmonary:  ?   Effort: Pulmonary effort is normal. No respiratory distress.  ?   Breath sounds: Normal breath sounds.  ?Chest:  ?   Chest wall: No tenderness.  ?Abdominal:  ?   Palpations: Abdomen is soft.  ?   Tenderness: There is abdominal tenderness. There is no guarding or rebound.  ?   Comments: Gravid abdomen ?Firm and tender  ?Genitourinary: ?   Comments: Chaperone present. Augusto Gamble RN.  ?Cervix 1 to 2 cm dilated. ?Appears to be high ?Musculoskeletal:     ?   General: No tenderness. Normal range of motion.  ?    Cervical back: Normal range of motion and neck supple.  ?Skin: ?   General: Skin is warm.  ?Neurological:  ?   Mental Status: She is alert and oriented to person, place, and time.  ?   Cranial Nerves: No cranial nerve deficit.  ?   Motor: No abnormal muscle tone.  ?   Coordination: Coordination normal.  ?   Comments:  5/5 strength throughout. CN 2-12 intact.Equal grip strength.   ?Psychiatric:     ?   Behavior: Behavior normal.  ? ? ?ED Results / Procedures / Treatments   ?Labs ?(all labs ordered are listed, but only abnormal results are displayed) ?Labs Reviewed  ?CBC WITH DIFFERENTIAL/PLATELET - Abnormal; Notable for the following components:  ?    Result Value  ? WBC 16.9 (*)   ? Hemoglobin 11.0 (*)   ? HCT 33.8 (*)   ? Neutro Abs 13.6 (*)   ? Monocytes Absolute 1.2 (*)   ? Abs Immature Granulocytes 0.13 (*)   ? All other components within normal limits  ?COMPREHENSIVE METABOLIC PANEL - Abnormal; Notable for the following components:  ? CO2 19 (*)   ? Creatinine, Ser 1.18 (*)   ? Calcium 8.6 (*)   ? Total Protein 6.4 (*)   ? Albumin 3.0 (*)   ? Alkaline Phosphatase 165 (*)   ? All other components within normal limits  ? ? ?EKG ?None ? ?Radiology ?No results found. ? ?Procedures ?Marland KitchenCritical Care ?Performed by: Glynn Octave, MD ?Authorized by: Glynn Octave, MD  ? ?Critical care provider statement:  ?  Critical care time (minutes):  35 ?  Critical care time was exclusive of:  Separately billable procedures and treating other patients ?  Critical care was necessary to treat or prevent imminent or life-threatening deterioration of the following conditions: labor. ?  Critical care was time spent personally by me on the following activities:  Development of treatment plan with patient or surrogate, discussions with consultants, evaluation of patient's response to treatment, examination of patient, ordering and review of laboratory studies, ordering and review of radiographic studies, ordering and performing  treatments and interventions, pulse oximetry, re-evaluation of patient's condition and review of old charts ?  I assumed direction of critical care for this patient from another provider in my specialty: no    ? ? ?Medications Ordered in ED ?Medications  ?sodium chloride 0.9 % bolus 1,000 mL (has no administration in time range)  ? ? ?ED Course/ Medical Decision Making/ A&P ?  ?                        ?  Medical Decision Making ?Amount and/or Complexity of Data Reviewed ?Labs: ordered. ? ?Full-term pregnancy here with lower abdominal pain and pressure with concern for labor.  Blood pressure elevated. Abdomen is firm and tender. ? ?Labs will be obtained, IV fluids will be started ? ?Fetal heart tones difficult to find, coming and going.  Appear to be around 120. ?Patient has on toco monitor. ? ?Discussed with Dr. Tyson AliasGifford on-call for [facility]. ? ?She agrees with transfer to labor and delivery.  Does not recommend any blood pressure treatment at this point. ?Blood pressure remains elevated.  Dr. Tyson AliasGifford recommends no magnesium or other treatment at this point and just recommends transfer. ? ?Patient cervix rechecked before transfer and she is not crowning. Stable for transfer.  ? ? ? ? ? ? ? ?Final Clinical Impression(s) / ED Diagnoses ?Final diagnoses:  ?Abdominal pain during pregnancy in third trimester  ? ? ?Rx / DC Orders ?ED Discharge Orders   ? ? None  ? ?  ? ? ?  ?Glynn Octaveancour, Siriyah Ambrosius, MD ?02/12/22 2318 ? ?  ?Glynn Octaveancour, Iyad Deroo, MD ?02/12/22 2355 ? ?

## 2022-02-12 NOTE — ED Triage Notes (Signed)
Pt arrives pov, steady gait with c/o lower abdominal cramping starting today. Endorses diarrhea, denies fever. Decreased fetal movement ?

## 2022-02-16 DIAGNOSIS — Z87440 Personal history of urinary (tract) infections: Secondary | ICD-10-CM | POA: Diagnosis not present

## 2022-02-16 DIAGNOSIS — O165 Unspecified maternal hypertension, complicating the puerperium: Secondary | ICD-10-CM | POA: Diagnosis not present

## 2022-02-16 DIAGNOSIS — O1415 Severe pre-eclampsia, complicating the puerperium: Secondary | ICD-10-CM | POA: Diagnosis not present

## 2022-02-16 DIAGNOSIS — Z9889 Other specified postprocedural states: Secondary | ICD-10-CM | POA: Diagnosis not present

## 2022-02-16 DIAGNOSIS — Z825 Family history of asthma and other chronic lower respiratory diseases: Secondary | ICD-10-CM | POA: Diagnosis not present

## 2022-04-21 DIAGNOSIS — M545 Low back pain, unspecified: Secondary | ICD-10-CM | POA: Diagnosis not present

## 2022-04-27 IMAGING — US US OB < 14 WEEKS - US OB TV
1 series · 14 of 28 positions shown · non-contrast
Comparison: None similar

CLINICAL DATA: Pelvic pain in first trimester pregnancy

EXAM:
OBSTETRIC <14 WK US AND TRANSVAGINAL OB US
TECHNIQUE: Both transabdominal and transvaginal ultrasound examinations were
performed for complete evaluation of the gestation as well as the
maternal uterus, adnexal regions, and pelvic cul-de-sac.
Transvaginal technique was performed to assess early pregnancy.

[Series 1: us ob < 14 weeks - us ob tv · 14 of 53 slices shown]
[im 2/53]
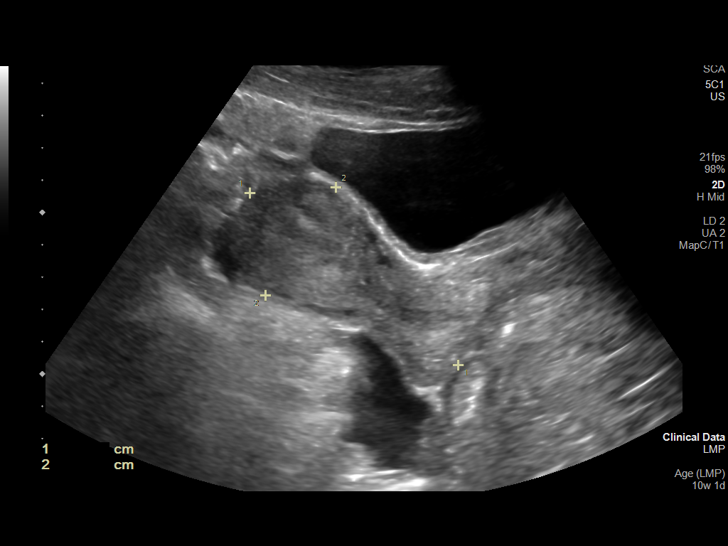
[im 6/53]
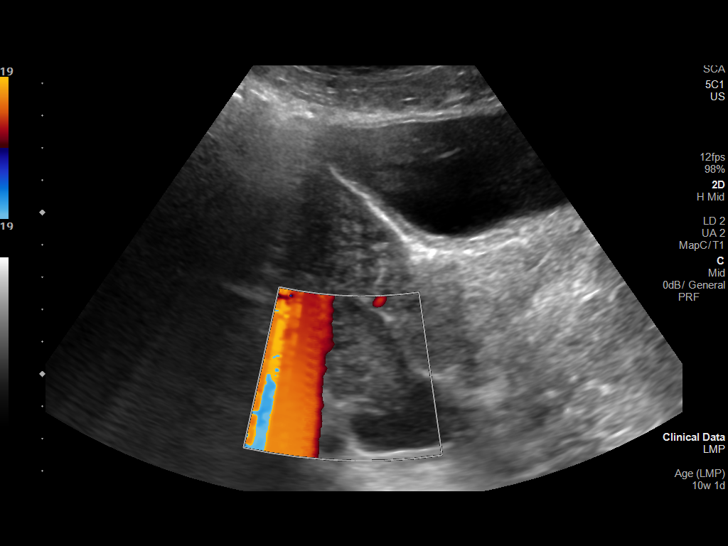
[im 10/53]
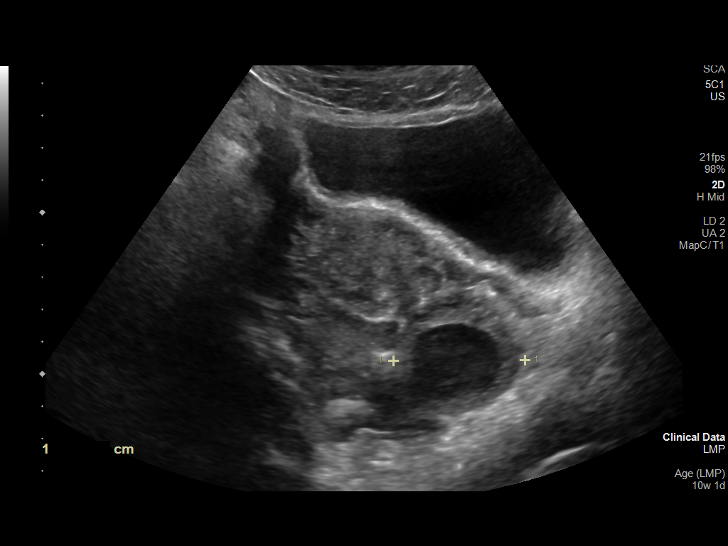
[im 14/53]
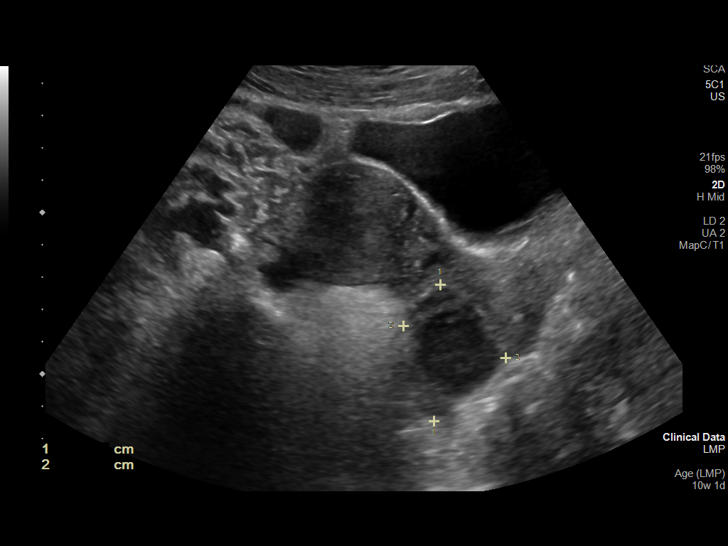
[im 18/53]
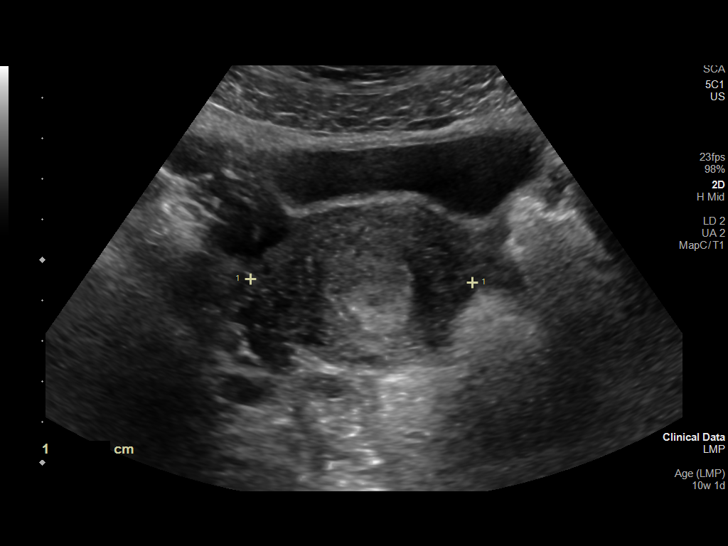
[im 22/53]
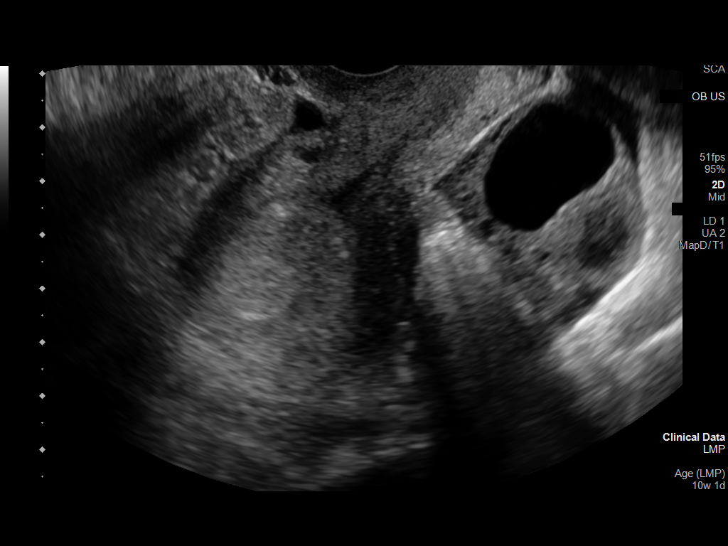
[im 26/53]
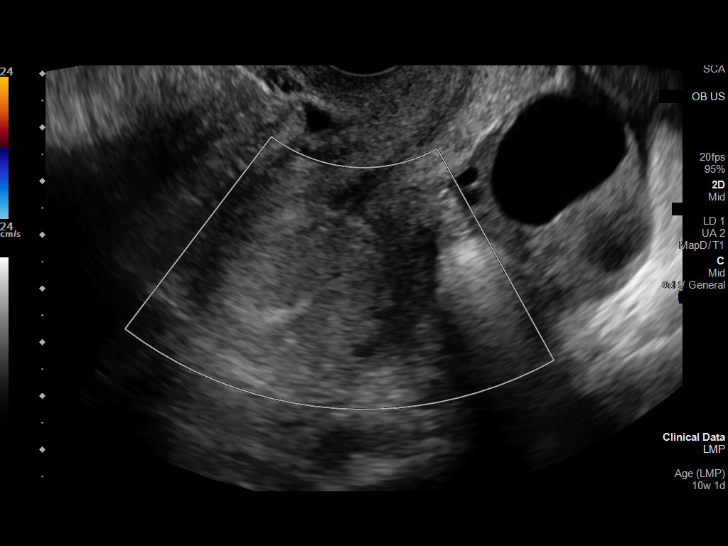
[im 29/53]
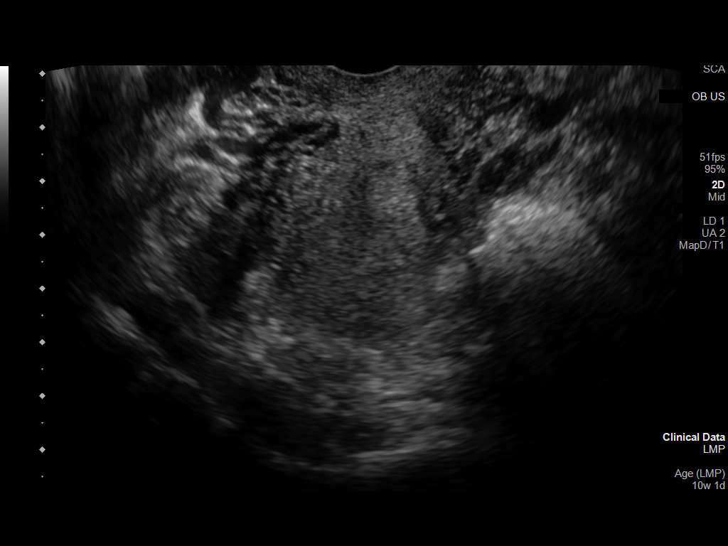
[im 33/53]
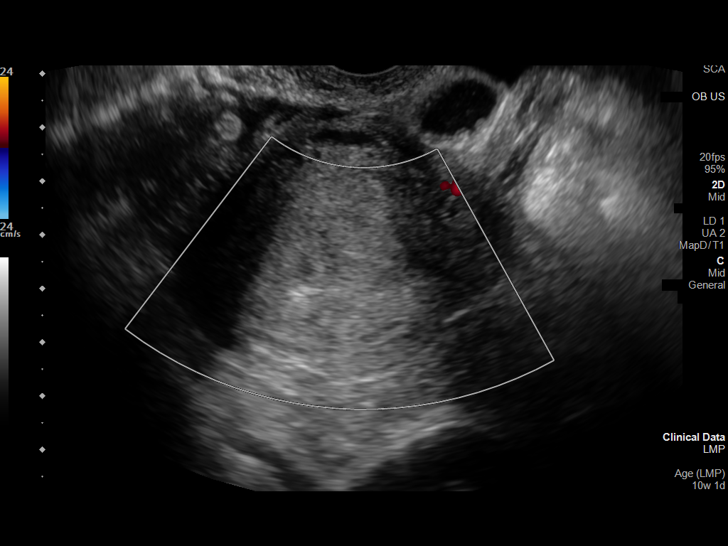
[im 37/53]
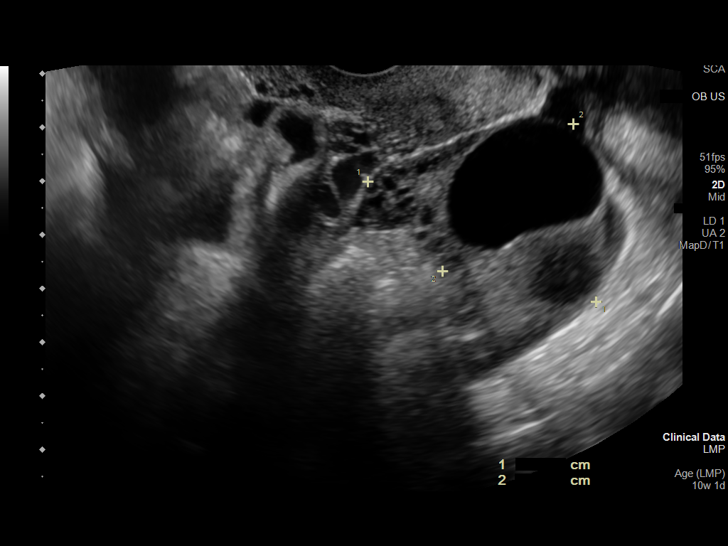
[im 41/53]
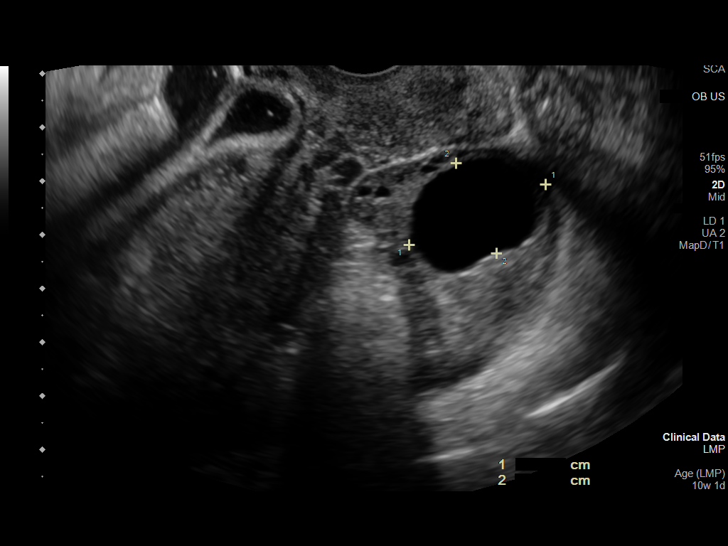
[im 45/53]
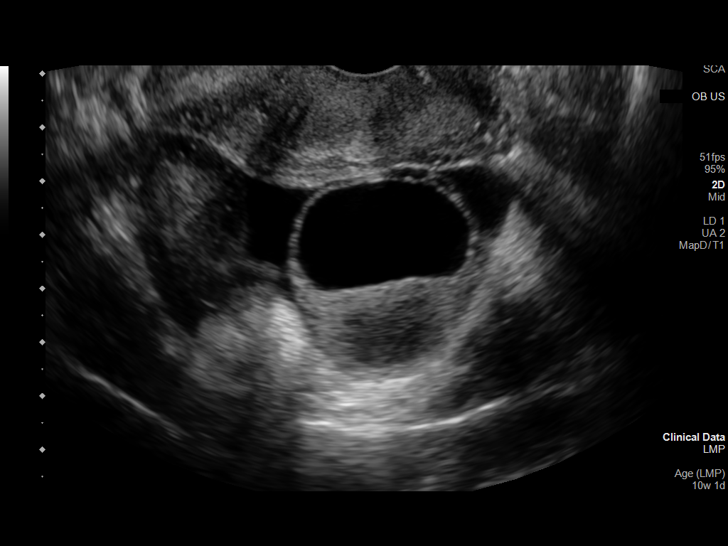
[im 49/53]
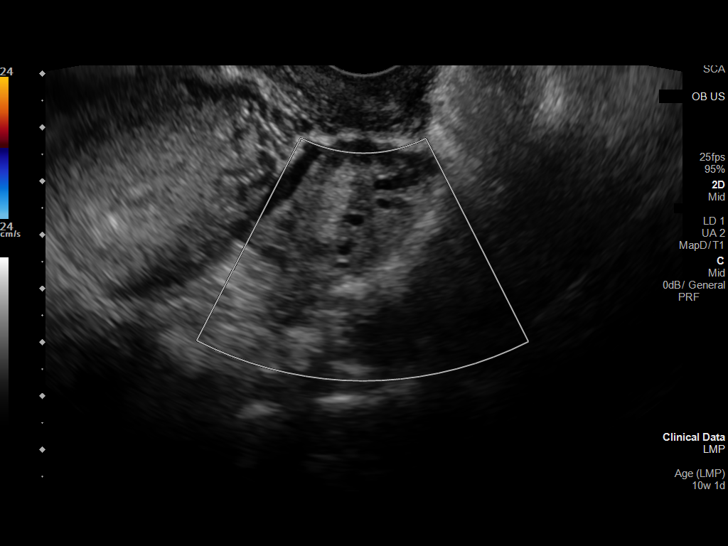
[im 53/53]
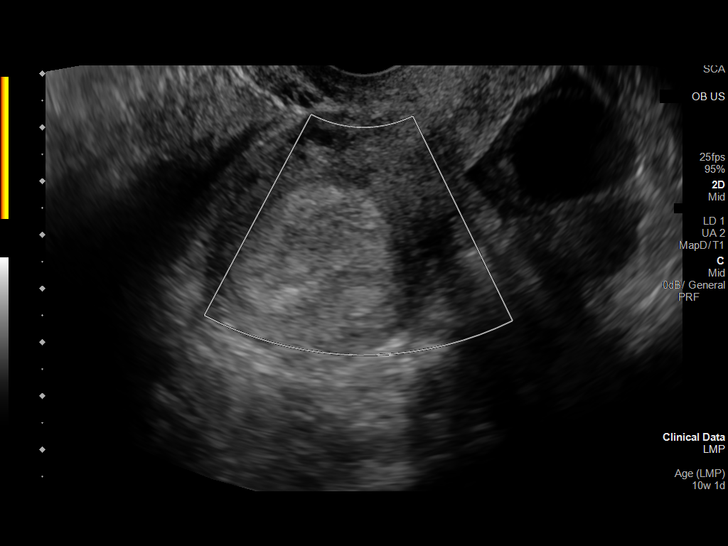

[14 of 28 positions shown; findings below may reference images not displayed]

FINDINGS: No intra or extra uterine gestational sac is seen. 3 cm simple cyst
in the pelvis which is left intra-ovarian. No extra ovarian adnexal
mass. Small volume pelvic fluid which appears simple. Normal
morphology of the uterus with 19 mm stripe.
IMPRESSION: 1. Pregnancy of unknown location, unexpected based on clinical
dates. Differential considerations include intrauterine gestation
too early to be sonographically visualized, spontaneous abortion, or
ectopic pregnancy. Consider follow-up ultrasound in 10 days and
serial quantitative beta HCG follow-up.
2. Small volume pelvic fluid.

## 2022-05-30 DIAGNOSIS — N912 Amenorrhea, unspecified: Secondary | ICD-10-CM | POA: Diagnosis not present

## 2022-05-30 DIAGNOSIS — Z3202 Encounter for pregnancy test, result negative: Secondary | ICD-10-CM | POA: Diagnosis not present

## 2022-05-30 DIAGNOSIS — Z3189 Encounter for other procreative management: Secondary | ICD-10-CM | POA: Diagnosis not present

## 2022-06-28 DIAGNOSIS — Z13228 Encounter for screening for other metabolic disorders: Secondary | ICD-10-CM | POA: Diagnosis not present

## 2022-06-28 DIAGNOSIS — Z13220 Encounter for screening for lipoid disorders: Secondary | ICD-10-CM | POA: Diagnosis not present

## 2022-06-28 DIAGNOSIS — Z131 Encounter for screening for diabetes mellitus: Secondary | ICD-10-CM | POA: Diagnosis not present

## 2022-06-28 DIAGNOSIS — Z1321 Encounter for screening for nutritional disorder: Secondary | ICD-10-CM | POA: Diagnosis not present

## 2022-06-28 DIAGNOSIS — Z2821 Immunization not carried out because of patient refusal: Secondary | ICD-10-CM | POA: Diagnosis not present

## 2022-06-28 DIAGNOSIS — Z1329 Encounter for screening for other suspected endocrine disorder: Secondary | ICD-10-CM | POA: Diagnosis not present

## 2022-06-28 DIAGNOSIS — Z0000 Encounter for general adult medical examination without abnormal findings: Secondary | ICD-10-CM | POA: Diagnosis not present

## 2022-06-28 DIAGNOSIS — Z130 Encounter for screening for diseases of the blood and blood-forming organs and certain disorders involving the immune mechanism: Secondary | ICD-10-CM | POA: Diagnosis not present

## 2022-07-30 DIAGNOSIS — R519 Headache, unspecified: Secondary | ICD-10-CM | POA: Diagnosis not present

## 2022-07-30 DIAGNOSIS — M549 Dorsalgia, unspecified: Secondary | ICD-10-CM | POA: Diagnosis not present

## 2022-07-30 DIAGNOSIS — S060X0A Concussion without loss of consciousness, initial encounter: Secondary | ICD-10-CM | POA: Diagnosis not present

## 2022-07-30 DIAGNOSIS — R1031 Right lower quadrant pain: Secondary | ICD-10-CM | POA: Diagnosis not present

## 2022-08-04 DIAGNOSIS — M7918 Myalgia, other site: Secondary | ICD-10-CM | POA: Diagnosis not present

## 2022-08-04 DIAGNOSIS — Z09 Encounter for follow-up examination after completed treatment for conditions other than malignant neoplasm: Secondary | ICD-10-CM | POA: Diagnosis not present

## 2022-10-17 DIAGNOSIS — F4323 Adjustment disorder with mixed anxiety and depressed mood: Secondary | ICD-10-CM | POA: Diagnosis not present

## 2022-11-17 ENCOUNTER — Ambulatory Visit (HOSPITAL_COMMUNITY)
Admission: EM | Admit: 2022-11-17 | Discharge: 2022-11-17 | Disposition: A | Discharge status: Home / Self Care | Payer: BC Managed Care – PPO | Attending: Internal Medicine | Admitting: Internal Medicine

## 2022-11-17 ENCOUNTER — Encounter (HOSPITAL_COMMUNITY): Payer: Self-pay

## 2022-11-17 DIAGNOSIS — R197 Diarrhea, unspecified: Secondary | ICD-10-CM

## 2022-11-17 DIAGNOSIS — R1010 Upper abdominal pain, unspecified: Secondary | ICD-10-CM | POA: Diagnosis not present

## 2022-11-17 DIAGNOSIS — R112 Nausea with vomiting, unspecified: Secondary | ICD-10-CM

## 2022-11-17 LAB — POCT URINALYSIS DIPSTICK, ED / UC
Glucose, UA: NEGATIVE mg/dL
Hgb urine dipstick: NEGATIVE
Leukocytes,Ua: NEGATIVE
Nitrite: NEGATIVE
Protein, ur: 30 mg/dL — AB
Specific Gravity, Urine: 1.02 (ref 1.005–1.030)
Urobilinogen, UA: 0.2 mg/dL (ref 0.0–1.0)
pH: 6 (ref 5.0–8.0)

## 2022-11-17 LAB — POC URINE PREG, ED: Preg Test, Ur: NEGATIVE

## 2022-11-17 MED ORDER — ONDANSETRON 4 MG PO TBDP: 4.0000 mg | ORAL_TABLET | Freq: Three times a day (TID) | ORAL | 0 refills | Status: DC | PRN

## 2022-11-17 MED ORDER — ONDANSETRON 4 MG PO TBDP
ORAL_TABLET | ORAL | Status: AC
  Filled 2022-11-17: qty 1

## 2022-11-17 MED ORDER — ONDANSETRON 4 MG PO TBDP
4.0000 mg | ORAL_TABLET | Freq: Once | ORAL | Status: AC
  Administered 2022-11-17: 4 mg via ORAL
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
## 2022-11-17 NOTE — ED Triage Notes (Signed)
Pt presents with middle stomach pain x 2 days. Pt also endorses headache, emesis, and diarrhea as well.

## 2022-11-17 NOTE — Discharge Instructions (Addendum)
Your evaluation suggests that your symptoms are most likely due to viral stomach illness (gastroenteritis) which will improve on its own with rest and fluids in the next few days.   I have prescribed an antinausea medication for you to take at home called Zofran. It is the same medication that we gave you in the office.  You may use tylenol 1,000mg  every 6 hours over the counter as needed for abdominal discomfort related to this virus.   Eat a bland diet for the next 12-24 hours (bananas, rice, white toast, and applesauce) once you are able to tolerate liquids (broth, etc). These foods are easy for your stomach to digest. Pedialyte can be purchased to help with rehydration. Drink plenty of water.   NO ICE CREAM until you're better!! ;) Vanilla pudding will have to do.  Please follow up with your primary care provider for further management. Return if you experience worsening or uncontrolled pain, inability to tolerate fluids by mouth, difficulty breathing, fevers 100.28F or greater, recurrent vomiting, or any other concerning symptoms. I hope you feel better!

## 2022-12-15 DIAGNOSIS — F320 Major depressive disorder, single episode, mild: Secondary | ICD-10-CM | POA: Diagnosis not present

## 2022-12-21 DIAGNOSIS — F320 Major depressive disorder, single episode, mild: Secondary | ICD-10-CM | POA: Diagnosis not present

## 2022-12-28 DIAGNOSIS — F320 Major depressive disorder, single episode, mild: Secondary | ICD-10-CM | POA: Diagnosis not present

## 2023-01-05 DIAGNOSIS — F320 Major depressive disorder, single episode, mild: Secondary | ICD-10-CM | POA: Diagnosis not present

## 2023-01-11 DIAGNOSIS — F320 Major depressive disorder, single episode, mild: Secondary | ICD-10-CM | POA: Diagnosis not present

## 2023-01-18 DIAGNOSIS — F320 Major depressive disorder, single episode, mild: Secondary | ICD-10-CM | POA: Diagnosis not present

## 2023-07-06 DIAGNOSIS — F3181 Bipolar II disorder: Secondary | ICD-10-CM | POA: Diagnosis not present

## 2023-07-13 DIAGNOSIS — F4321 Adjustment disorder with depressed mood: Secondary | ICD-10-CM | POA: Diagnosis not present

## 2023-07-17 DIAGNOSIS — F4321 Adjustment disorder with depressed mood: Secondary | ICD-10-CM | POA: Diagnosis not present

## 2023-07-25 DIAGNOSIS — M545 Low back pain, unspecified: Secondary | ICD-10-CM | POA: Diagnosis not present

## 2023-07-25 DIAGNOSIS — G8929 Other chronic pain: Secondary | ICD-10-CM | POA: Diagnosis not present

## 2023-07-26 DIAGNOSIS — F4321 Adjustment disorder with depressed mood: Secondary | ICD-10-CM | POA: Diagnosis not present

## 2023-08-01 DIAGNOSIS — F4321 Adjustment disorder with depressed mood: Secondary | ICD-10-CM | POA: Diagnosis not present

## 2023-08-08 DIAGNOSIS — F3181 Bipolar II disorder: Secondary | ICD-10-CM | POA: Diagnosis not present

## 2023-08-08 DIAGNOSIS — F4321 Adjustment disorder with depressed mood: Secondary | ICD-10-CM | POA: Diagnosis not present

## 2023-08-09 ENCOUNTER — Telehealth: Payer: Self-pay | Admitting: Nurse Practitioner

## 2023-08-09 NOTE — Telephone Encounter (Signed)
Lvmtcb to schedule an appt

## 2023-08-10 DIAGNOSIS — M545 Low back pain, unspecified: Secondary | ICD-10-CM | POA: Diagnosis not present

## 2023-08-10 DIAGNOSIS — G8929 Other chronic pain: Secondary | ICD-10-CM | POA: Diagnosis not present

## 2023-08-15 ENCOUNTER — Other Ambulatory Visit (HOSPITAL_COMMUNITY)
Admission: RE | Admit: 2023-08-15 | Discharge: 2023-08-15 | Disposition: A | Discharge status: Home / Self Care | Payer: BC Managed Care – PPO | Source: Ambulatory Visit | Attending: Nurse Practitioner | Admitting: Nurse Practitioner

## 2023-08-15 ENCOUNTER — Ambulatory Visit (INDEPENDENT_AMBULATORY_CARE_PROVIDER_SITE_OTHER): Payer: BC Managed Care – PPO | Admitting: Nurse Practitioner

## 2023-08-15 ENCOUNTER — Encounter: Payer: Self-pay | Admitting: Nurse Practitioner

## 2023-08-15 VITALS — BP 126/86 | HR 98 | Temp 98.7°F | Resp 18 | Wt 144.8 lb

## 2023-08-15 DIAGNOSIS — J069 Acute upper respiratory infection, unspecified: Secondary | ICD-10-CM | POA: Diagnosis not present

## 2023-08-15 DIAGNOSIS — R350 Frequency of micturition: Secondary | ICD-10-CM | POA: Insufficient documentation

## 2023-08-15 DIAGNOSIS — R3129 Other microscopic hematuria: Secondary | ICD-10-CM

## 2023-08-15 DIAGNOSIS — F4321 Adjustment disorder with depressed mood: Secondary | ICD-10-CM | POA: Diagnosis not present

## 2023-08-15 DIAGNOSIS — M545 Low back pain, unspecified: Secondary | ICD-10-CM

## 2023-08-15 DIAGNOSIS — G8929 Other chronic pain: Secondary | ICD-10-CM | POA: Diagnosis not present

## 2023-08-15 LAB — POCT URINALYSIS DIPSTICK
Bilirubin, UA: NEGATIVE
Glucose, UA: NEGATIVE
Nitrite, UA: NEGATIVE
Odor: NORMAL
Protein, UA: POSITIVE — AB
Spec Grav, UA: 1.02 (ref 1.010–1.025)
Urobilinogen, UA: NEGATIVE U/dL — AB
pH, UA: 6 (ref 5.0–8.0)

## 2023-08-15 LAB — POCT INFLUENZA A/B
Influenza A, POC: NEGATIVE
Influenza B, POC: NEGATIVE

## 2023-08-15 LAB — POC COVID19 BINAXNOW: SARS Coronavirus 2 Ag: NEGATIVE

## 2023-08-15 LAB — POCT URINE PREGNANCY: Preg Test, Ur: NEGATIVE
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
## 2023-08-15 NOTE — Patient Instructions (Addendum)
URI Instructions: Encourage adequate oral hydration. Use over-the-counter  flonase or claritin or zyrtec or allegra for sinus congestion.  Use mucinex DM or Robitussin  or delsym for cough without congestion  You can use plain "Tylenol" or "Advil" for fever, chills and achyness. Use cool mist humidifier at bedtime to help with nasal congestion and cough.  Cold/cough medications may have tylenol or ibuprofen or guaifenesin or dextromethophan in them, so be careful not to take beyond the recommended dose for each of these medications.   "Common cold" symptoms are usually triggered by a virus.  The antibiotics are usually not necessary. On average, a" viral cold" illness may take 7-10 days to resolve. Please, make an appointment if you are not better or if you're worse.   Go to [address] for back x-ray. Urine sent for culture, and cytology

## 2023-08-16 LAB — URINE CYTOLOGY ANCILLARY ONLY
Chlamydia: NEGATIVE
Comment: NEGATIVE
Comment: NEGATIVE
Comment: NORMAL
Neisseria Gonorrhea: NEGATIVE
Trichomonas: NEGATIVE

## 2023-08-16 LAB — URINE CULTURE
MICRO NUMBER:: 15688477
SPECIMEN QUALITY:: ADEQUATE

## 2023-08-21 DIAGNOSIS — R319 Hematuria, unspecified: Secondary | ICD-10-CM | POA: Diagnosis not present

## 2023-08-22 DIAGNOSIS — F4321 Adjustment disorder with depressed mood: Secondary | ICD-10-CM | POA: Diagnosis not present

## 2023-08-29 DIAGNOSIS — F4321 Adjustment disorder with depressed mood: Secondary | ICD-10-CM | POA: Diagnosis not present

## 2023-08-30 DIAGNOSIS — Z124 Encounter for screening for malignant neoplasm of cervix: Secondary | ICD-10-CM | POA: Diagnosis not present

## 2023-08-30 DIAGNOSIS — Z6823 Body mass index (BMI) 23.0-23.9, adult: Secondary | ICD-10-CM | POA: Diagnosis not present

## 2023-08-30 DIAGNOSIS — N760 Acute vaginitis: Secondary | ICD-10-CM | POA: Diagnosis not present

## 2023-08-30 DIAGNOSIS — Z1151 Encounter for screening for human papillomavirus (HPV): Secondary | ICD-10-CM | POA: Diagnosis not present

## 2023-08-30 DIAGNOSIS — N926 Irregular menstruation, unspecified: Secondary | ICD-10-CM | POA: Diagnosis not present

## 2023-08-30 DIAGNOSIS — Z01419 Encounter for gynecological examination (general) (routine) without abnormal findings: Secondary | ICD-10-CM | POA: Diagnosis not present

## 2023-08-30 DIAGNOSIS — Z113 Encounter for screening for infections with a predominantly sexual mode of transmission: Secondary | ICD-10-CM | POA: Diagnosis not present

## 2023-09-05 DIAGNOSIS — F4321 Adjustment disorder with depressed mood: Secondary | ICD-10-CM | POA: Diagnosis not present

## 2023-09-05 DIAGNOSIS — F3181 Bipolar II disorder: Secondary | ICD-10-CM | POA: Diagnosis not present

## 2023-09-12 DIAGNOSIS — F4321 Adjustment disorder with depressed mood: Secondary | ICD-10-CM | POA: Diagnosis not present

## 2023-09-14 DIAGNOSIS — F3181 Bipolar II disorder: Secondary | ICD-10-CM | POA: Diagnosis not present

## 2023-09-22 DIAGNOSIS — F4321 Adjustment disorder with depressed mood: Secondary | ICD-10-CM | POA: Diagnosis not present

## 2023-09-26 DIAGNOSIS — F4321 Adjustment disorder with depressed mood: Secondary | ICD-10-CM | POA: Diagnosis not present

## 2023-10-09 DIAGNOSIS — F3181 Bipolar II disorder: Secondary | ICD-10-CM | POA: Diagnosis not present

## 2023-10-10 DIAGNOSIS — F4321 Adjustment disorder with depressed mood: Secondary | ICD-10-CM | POA: Diagnosis not present

## 2023-10-12 ENCOUNTER — Encounter: Payer: BC Managed Care – PPO | Admitting: Nurse Practitioner

## 2023-10-17 DIAGNOSIS — F4321 Adjustment disorder with depressed mood: Secondary | ICD-10-CM | POA: Diagnosis not present

## 2023-10-24 DIAGNOSIS — F4321 Adjustment disorder with depressed mood: Secondary | ICD-10-CM | POA: Diagnosis not present

## 2023-11-07 DIAGNOSIS — F4321 Adjustment disorder with depressed mood: Secondary | ICD-10-CM | POA: Diagnosis not present

## 2023-11-10 DIAGNOSIS — N911 Secondary amenorrhea: Secondary | ICD-10-CM | POA: Diagnosis not present

## 2023-11-20 DIAGNOSIS — Z3A01 Less than 8 weeks gestation of pregnancy: Secondary | ICD-10-CM | POA: Diagnosis not present

## 2023-11-20 DIAGNOSIS — Z3685 Encounter for antenatal screening for Streptococcus B: Secondary | ICD-10-CM | POA: Diagnosis not present

## 2023-11-20 DIAGNOSIS — Z3481 Encounter for supervision of other normal pregnancy, first trimester: Secondary | ICD-10-CM | POA: Diagnosis not present

## 2023-11-30 DIAGNOSIS — F3181 Bipolar II disorder: Secondary | ICD-10-CM | POA: Diagnosis not present

## 2023-12-05 DIAGNOSIS — F4321 Adjustment disorder with depressed mood: Secondary | ICD-10-CM | POA: Diagnosis not present

## 2023-12-06 DIAGNOSIS — Z3481 Encounter for supervision of other normal pregnancy, first trimester: Secondary | ICD-10-CM | POA: Diagnosis not present

## 2023-12-06 DIAGNOSIS — Z113 Encounter for screening for infections with a predominantly sexual mode of transmission: Secondary | ICD-10-CM | POA: Diagnosis not present

## 2023-12-06 DIAGNOSIS — Z3A10 10 weeks gestation of pregnancy: Secondary | ICD-10-CM | POA: Diagnosis not present

## 2023-12-12 DIAGNOSIS — F4321 Adjustment disorder with depressed mood: Secondary | ICD-10-CM | POA: Diagnosis not present

## 2023-12-12 DIAGNOSIS — Z3481 Encounter for supervision of other normal pregnancy, first trimester: Secondary | ICD-10-CM | POA: Diagnosis not present

## 2023-12-15 DIAGNOSIS — F3181 Bipolar II disorder: Secondary | ICD-10-CM | POA: Diagnosis not present

## 2023-12-19 DIAGNOSIS — F4321 Adjustment disorder with depressed mood: Secondary | ICD-10-CM | POA: Diagnosis not present

## 2023-12-25 DIAGNOSIS — O039 Complete or unspecified spontaneous abortion without complication: Secondary | ICD-10-CM | POA: Diagnosis not present

## 2023-12-28 DIAGNOSIS — F3181 Bipolar II disorder: Secondary | ICD-10-CM | POA: Diagnosis not present

## 2024-01-02 DIAGNOSIS — F4321 Adjustment disorder with depressed mood: Secondary | ICD-10-CM | POA: Diagnosis not present

## 2024-01-02 DIAGNOSIS — O039 Complete or unspecified spontaneous abortion without complication: Secondary | ICD-10-CM | POA: Diagnosis not present

## 2024-01-10 DIAGNOSIS — O0281 Inappropriate change in quantitative human chorionic gonadotropin (hCG) in early pregnancy: Secondary | ICD-10-CM | POA: Diagnosis not present

## 2024-01-10 DIAGNOSIS — O039 Complete or unspecified spontaneous abortion without complication: Secondary | ICD-10-CM | POA: Diagnosis not present

## 2024-03-07 DIAGNOSIS — N760 Acute vaginitis: Secondary | ICD-10-CM | POA: Diagnosis not present

## 2024-03-07 DIAGNOSIS — N926 Irregular menstruation, unspecified: Secondary | ICD-10-CM | POA: Diagnosis not present

## 2024-03-07 DIAGNOSIS — R350 Frequency of micturition: Secondary | ICD-10-CM | POA: Diagnosis not present

## 2024-04-02 DIAGNOSIS — Z6820 Body mass index (BMI) 20.0-20.9, adult: Secondary | ICD-10-CM | POA: Diagnosis not present

## 2024-04-02 DIAGNOSIS — R399 Unspecified symptoms and signs involving the genitourinary system: Secondary | ICD-10-CM | POA: Diagnosis not present

## 2024-04-02 DIAGNOSIS — R319 Hematuria, unspecified: Secondary | ICD-10-CM | POA: Diagnosis not present

## 2024-04-02 DIAGNOSIS — Z01419 Encounter for gynecological examination (general) (routine) without abnormal findings: Secondary | ICD-10-CM | POA: Diagnosis not present

## 2024-04-02 DIAGNOSIS — N390 Urinary tract infection, site not specified: Secondary | ICD-10-CM | POA: Diagnosis not present

## 2024-04-02 DIAGNOSIS — Z113 Encounter for screening for infections with a predominantly sexual mode of transmission: Secondary | ICD-10-CM | POA: Diagnosis not present

## 2024-04-23 DIAGNOSIS — Z0000 Encounter for general adult medical examination without abnormal findings: Secondary | ICD-10-CM | POA: Diagnosis not present

## 2024-04-23 DIAGNOSIS — E559 Vitamin D deficiency, unspecified: Secondary | ICD-10-CM | POA: Diagnosis not present

## 2024-04-23 DIAGNOSIS — E538 Deficiency of other specified B group vitamins: Secondary | ICD-10-CM | POA: Diagnosis not present

## 2024-08-12 DIAGNOSIS — F4321 Adjustment disorder with depressed mood: Secondary | ICD-10-CM | POA: Diagnosis not present

## 2024-08-26 DIAGNOSIS — F4321 Adjustment disorder with depressed mood: Secondary | ICD-10-CM | POA: Diagnosis not present

## 2024-08-29 DIAGNOSIS — F4323 Adjustment disorder with mixed anxiety and depressed mood: Secondary | ICD-10-CM | POA: Diagnosis not present

## 2024-09-04 DIAGNOSIS — R300 Dysuria: Secondary | ICD-10-CM | POA: Diagnosis not present

## 2024-09-09 DIAGNOSIS — F4321 Adjustment disorder with depressed mood: Secondary | ICD-10-CM | POA: Diagnosis not present
